# Patient Record
Sex: Male | Born: 1945 | Race: White | Hispanic: No | Marital: Married | Smoking: Former smoker
Health system: Southern US, Community
[De-identification: ages and names within clinical notes are randomized; demographics above are authoritative.]

## PROBLEM LIST (undated history)

## (undated) DIAGNOSIS — I77810 Thoracic aortic ectasia: Secondary | ICD-10-CM

## (undated) DIAGNOSIS — E669 Obesity, unspecified: Secondary | ICD-10-CM

## (undated) DIAGNOSIS — I10 Essential (primary) hypertension: Secondary | ICD-10-CM

## (undated) DIAGNOSIS — G4733 Obstructive sleep apnea (adult) (pediatric): Secondary | ICD-10-CM

## (undated) DIAGNOSIS — E119 Type 2 diabetes mellitus without complications: Secondary | ICD-10-CM

## (undated) HISTORY — DX: Obstructive sleep apnea (adult) (pediatric): G47.33

## (undated) HISTORY — DX: Obesity, unspecified: E66.9

## (undated) HISTORY — DX: Essential (primary) hypertension: I10

## (undated) HISTORY — DX: Thoracic aortic ectasia: I77.810

---

## 2005-02-02 ENCOUNTER — Emergency Department (HOSPITAL_COMMUNITY): Admission: EM | Admit: 2005-02-02 | Discharge: 2005-02-02 | Payer: Self-pay | Admitting: Emergency Medicine

## 2010-05-13 ENCOUNTER — Inpatient Hospital Stay (HOSPITAL_COMMUNITY): Admission: EM | Admit: 2010-05-13 | Discharge: 2010-05-17 | Payer: Self-pay | Admitting: Internal Medicine

## 2010-05-13 ENCOUNTER — Encounter: Payer: Self-pay | Admitting: Emergency Medicine

## 2010-05-14 ENCOUNTER — Ambulatory Visit: Payer: Self-pay | Admitting: Infectious Diseases

## 2011-03-17 LAB — DIFFERENTIAL
Basophils Absolute: 0 10*3/uL (ref 0.0–0.1)
Basophils Absolute: 0 10*3/uL (ref 0.0–0.1)
Eosinophils Absolute: 0 10*3/uL (ref 0.0–0.7)
Lymphocytes Relative: 11 % — ABNORMAL LOW (ref 12–46)
Lymphs Abs: 1.1 10*3/uL (ref 0.7–4.0)
Lymphs Abs: 1.3 10*3/uL (ref 0.7–4.0)
Monocytes Relative: 7 % (ref 3–12)
Monocytes Relative: 8 % (ref 3–12)
Neutro Abs: 7.8 10*3/uL — ABNORMAL HIGH (ref 1.7–7.7)
Neutrophils Relative %: 80 % — ABNORMAL HIGH (ref 43–77)

## 2011-03-17 LAB — CBC
HCT: 40.6 % (ref 39.0–52.0)
HCT: 41.5 % (ref 39.0–52.0)
Hemoglobin: 12.4 g/dL — ABNORMAL LOW (ref 13.0–17.0)
Hemoglobin: 13.9 g/dL (ref 13.0–17.0)
Hemoglobin: 14.5 g/dL (ref 13.0–17.0)
MCHC: 35 g/dL (ref 30.0–36.0)
MCV: 87.7 fL (ref 78.0–100.0)
Platelets: 170 10*3/uL (ref 150–400)
RBC: 4.05 MIL/uL — ABNORMAL LOW (ref 4.22–5.81)
RBC: 4.89 MIL/uL (ref 4.22–5.81)
RDW: 13.8 % (ref 11.5–15.5)
RDW: 13.9 % (ref 11.5–15.5)
WBC: 6.3 10*3/uL (ref 4.0–10.5)

## 2011-03-17 LAB — CSF CELL COUNT WITH DIFFERENTIAL
Lymphs, CSF: 37 % — ABNORMAL LOW (ref 40–80)
Segmented Neutrophils-CSF: 51 % — ABNORMAL HIGH (ref 0–6)
WBC, CSF: 20 /mm3 (ref 0–5)

## 2011-03-17 LAB — COMPREHENSIVE METABOLIC PANEL
ALT: 29 U/L (ref 0–53)
AST: 21 U/L (ref 0–37)
BUN: 11 mg/dL (ref 6–23)
CO2: 26 mEq/L (ref 19–32)
GFR calc Af Amer: >60 mL/min (ref >60)
Potassium: 4.5 mEq/L (ref 3.5–5.1)
Total Bilirubin: 1.7 mg/dL — ABNORMAL HIGH (ref 0.3–1.2)
Total Protein: 6.9 g/dL (ref 6.0–8.3)

## 2011-03-17 LAB — CSF CULTURE W GRAM STAIN

## 2011-03-17 LAB — GLUCOSE, CAPILLARY
Glucose-Capillary: 182 mg/dL — ABNORMAL HIGH (ref 70–99)
Glucose-Capillary: 202 mg/dL — ABNORMAL HIGH (ref 70–99)
Glucose-Capillary: 207 mg/dL — ABNORMAL HIGH (ref 70–99)
Glucose-Capillary: 214 mg/dL — ABNORMAL HIGH (ref 70–99)
Glucose-Capillary: 259 mg/dL — ABNORMAL HIGH (ref 70–99)
Glucose-Capillary: 265 mg/dL — ABNORMAL HIGH (ref 70–99)
Glucose-Capillary: 319 mg/dL — ABNORMAL HIGH (ref 70–99)

## 2011-03-17 LAB — HIV ANTIBODY (ROUTINE TESTING W REFLEX): HIV: NONREACTIVE

## 2011-03-17 LAB — BASIC METABOLIC PANEL
CO2: 25 mEq/L (ref 19–32)
Calcium: 8.6 mg/dL (ref 8.4–10.5)
Calcium: 8.7 mg/dL (ref 8.4–10.5)
Chloride: 100 mEq/L (ref 96–112)
Creatinine, Ser: 0.87 mg/dL (ref 0.4–1.5)
GFR calc Af Amer: >60 mL/min (ref >60)
GFR calc non Af Amer: >60 mL/min (ref >60)
GFR calc non Af Amer: >60 mL/min (ref >60)
Glucose, Bld: 159 mg/dL — ABNORMAL HIGH (ref 70–99)
Glucose, Bld: 214 mg/dL — ABNORMAL HIGH (ref 70–99)
Potassium: 4 mEq/L (ref 3.5–5.1)
Sodium: 138 mEq/L (ref 135–145)

## 2011-03-17 LAB — ROCKY MTN SPOTTED FVR AB, IGM-BLOOD: RMSF IgM: 0.09 IV (ref 0.00–0.89)

## 2011-03-17 LAB — CULTURE, BLOOD (ROUTINE X 2)

## 2011-03-17 LAB — ROCKY MTN SPOTTED FVR AB, IGG-BLOOD: RMSF IgG: 0.12 IV

## 2011-03-17 LAB — MRSA PCR SCREENING: MRSA by PCR: NEGATIVE

## 2011-03-17 LAB — PROTEIN AND GLUCOSE, CSF: Total  Protein, CSF: 489 mg/dL — ABNORMAL HIGH (ref 15–45)

## 2011-03-17 LAB — B. BURGDORFI ANTIBODIES: B burgdorferi Ab IgG+IgM: 0.73 {ISR}

## 2013-03-08 ENCOUNTER — Other Ambulatory Visit (HOSPITAL_COMMUNITY): Payer: Self-pay | Admitting: Nephrology

## 2013-03-17 ENCOUNTER — Ambulatory Visit (HOSPITAL_COMMUNITY)
Admission: RE | Admit: 2013-03-17 | Discharge: 2013-03-17 | Disposition: A | Discharge status: Home / Self Care | Payer: Medicare HMO | Source: Ambulatory Visit | Attending: Nephrology | Admitting: Nephrology

## 2013-03-17 DIAGNOSIS — N281 Cyst of kidney, acquired: Secondary | ICD-10-CM | POA: Insufficient documentation

## 2013-03-17 DIAGNOSIS — N289 Disorder of kidney and ureter, unspecified: Secondary | ICD-10-CM | POA: Insufficient documentation

## 2013-07-09 ENCOUNTER — Emergency Department (HOSPITAL_COMMUNITY): Payer: No Typology Code available for payment source

## 2013-07-09 ENCOUNTER — Encounter (HOSPITAL_COMMUNITY): Payer: Self-pay | Admitting: Emergency Medicine

## 2013-07-09 ENCOUNTER — Emergency Department (HOSPITAL_COMMUNITY)
Admission: EM | Admit: 2013-07-09 | Discharge: 2013-07-09 | Disposition: A | Discharge status: Home / Self Care | Payer: No Typology Code available for payment source | Attending: Emergency Medicine | Admitting: Emergency Medicine

## 2013-07-09 DIAGNOSIS — Z794 Long term (current) use of insulin: Secondary | ICD-10-CM | POA: Insufficient documentation

## 2013-07-09 DIAGNOSIS — Y92410 Unspecified street and highway as the place of occurrence of the external cause: Secondary | ICD-10-CM | POA: Diagnosis not present

## 2013-07-09 DIAGNOSIS — G8929 Other chronic pain: Secondary | ICD-10-CM | POA: Insufficient documentation

## 2013-07-09 DIAGNOSIS — Y9389 Activity, other specified: Secondary | ICD-10-CM | POA: Insufficient documentation

## 2013-07-09 DIAGNOSIS — Z79899 Other long term (current) drug therapy: Secondary | ICD-10-CM | POA: Diagnosis not present

## 2013-07-09 DIAGNOSIS — S298XXA Other specified injuries of thorax, initial encounter: Secondary | ICD-10-CM | POA: Diagnosis present

## 2013-07-09 DIAGNOSIS — E119 Type 2 diabetes mellitus without complications: Secondary | ICD-10-CM | POA: Diagnosis not present

## 2013-07-09 DIAGNOSIS — IMO0002 Reserved for concepts with insufficient information to code with codable children: Secondary | ICD-10-CM | POA: Insufficient documentation

## 2013-07-09 DIAGNOSIS — I10 Essential (primary) hypertension: Secondary | ICD-10-CM | POA: Diagnosis not present

## 2013-07-09 DIAGNOSIS — S20211A Contusion of right front wall of thorax, initial encounter: Secondary | ICD-10-CM

## 2013-07-09 DIAGNOSIS — S20219A Contusion of unspecified front wall of thorax, initial encounter: Secondary | ICD-10-CM | POA: Diagnosis not present

## 2013-07-09 DIAGNOSIS — Z7982 Long term (current) use of aspirin: Secondary | ICD-10-CM | POA: Diagnosis not present

## 2013-07-09 DIAGNOSIS — R0602 Shortness of breath: Secondary | ICD-10-CM | POA: Diagnosis not present

## 2013-07-09 DIAGNOSIS — S3981XA Other specified injuries of abdomen, initial encounter: Secondary | ICD-10-CM | POA: Insufficient documentation

## 2013-07-09 HISTORY — DX: Type 2 diabetes mellitus without complications: E11.9

## 2013-07-09 LAB — POTASSIUM: Potassium: 4.1 mEq/L (ref 3.5–5.1)

## 2013-07-09 LAB — TROPONIN I: Troponin I: <0.3 ng/mL (ref <0.30)

## 2013-07-09 LAB — CBC
HCT: 42.4 % (ref 39.0–52.0)
MCHC: 35.6 g/dL (ref 30.0–36.0)
Platelets: 204 10*3/uL (ref 150–400)
RDW: 14 % (ref 11.5–15.5)

## 2013-07-09 LAB — BASIC METABOLIC PANEL
BUN: 13 mg/dL (ref 6–23)
GFR calc Af Amer: 82 mL/min — ABNORMAL LOW (ref >90)
GFR calc non Af Amer: 71 mL/min — ABNORMAL LOW (ref >90)
Potassium: 6 mEq/L — ABNORMAL HIGH (ref 3.5–5.1)
Sodium: 136 mEq/L (ref 135–145)

## 2013-07-09 LAB — POCT I-STAT, CHEM 8
Glucose, Bld: 155 mg/dL — ABNORMAL HIGH (ref 70–99)
HCT: 45 % (ref 39.0–52.0)
Hemoglobin: 15.3 g/dL (ref 13.0–17.0)
Potassium: 6.1 mEq/L — ABNORMAL HIGH (ref 3.5–5.1)

## 2013-07-09 MED ORDER — SODIUM CHLORIDE 0.9 % IV BOLUS (SEPSIS)
1000.0000 mL | Freq: Once | INTRAVENOUS | Status: AC
Start: 2013-07-09 — End: 2013-07-09
  Administered 2013-07-09: 1000 mL via INTRAVENOUS

## 2013-07-09 MED ORDER — IOHEXOL 300 MG/ML  SOLN
100.0000 mL | Freq: Once | INTRAMUSCULAR | Status: AC | PRN
  Administered 2013-07-09: 100 mL via INTRAVENOUS

## 2013-07-09 MED ORDER — MORPHINE SULFATE 4 MG/ML IJ SOLN
4.0000 mg | Freq: Once | INTRAMUSCULAR | Status: AC
  Administered 2013-07-09: 4 mg via INTRAVENOUS
  Filled 2013-07-09: qty 1

## 2013-07-09 MED ORDER — HYDROCODONE-ACETAMINOPHEN 5-325 MG PO TABS: 2.0000 | ORAL_TABLET | ORAL | Status: DC | PRN

## 2013-07-09 MED ORDER — IBUPROFEN 800 MG PO TABS: 800.0000 mg | ORAL_TABLET | Freq: Three times a day (TID) | ORAL | Status: AC

## 2013-07-09 MED ORDER — ONDANSETRON HCL 4 MG/2ML IJ SOLN
4.0000 mg | Freq: Once | INTRAMUSCULAR | Status: AC
  Administered 2013-07-09: 4 mg via INTRAVENOUS
  Filled 2013-07-09: qty 2

## 2013-07-09 NOTE — ED Notes (Signed)
Pt reported he ambulated to the bathroom already today

## 2013-07-09 NOTE — ED Notes (Signed)
Pt to CT

## 2013-07-09 NOTE — ED Notes (Signed)
md at bedside  Pt alert and oriented x4. Respirations even and unlabored, bilateral symmetrical rise and fall of chest. Skin warm and dry. In no acute distress. Denies needs.   
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
## 2016-01-31 DIAGNOSIS — N400 Enlarged prostate without lower urinary tract symptoms: Secondary | ICD-10-CM | POA: Diagnosis not present

## 2016-01-31 DIAGNOSIS — M109 Gout, unspecified: Secondary | ICD-10-CM | POA: Diagnosis not present

## 2016-01-31 DIAGNOSIS — E1165 Type 2 diabetes mellitus with hyperglycemia: Secondary | ICD-10-CM | POA: Diagnosis not present

## 2016-01-31 DIAGNOSIS — Z125 Encounter for screening for malignant neoplasm of prostate: Secondary | ICD-10-CM | POA: Diagnosis not present

## 2016-01-31 DIAGNOSIS — I10 Essential (primary) hypertension: Secondary | ICD-10-CM | POA: Diagnosis not present

## 2016-01-31 DIAGNOSIS — M179 Osteoarthritis of knee, unspecified: Secondary | ICD-10-CM | POA: Diagnosis not present

## 2016-04-25 DIAGNOSIS — M25569 Pain in unspecified knee: Secondary | ICD-10-CM | POA: Diagnosis not present

## 2016-05-08 DIAGNOSIS — Z0000 Encounter for general adult medical examination without abnormal findings: Secondary | ICD-10-CM | POA: Diagnosis not present

## 2016-05-28 DIAGNOSIS — R072 Precordial pain: Secondary | ICD-10-CM | POA: Diagnosis not present

## 2016-05-28 DIAGNOSIS — R0602 Shortness of breath: Secondary | ICD-10-CM | POA: Diagnosis not present

## 2016-05-28 DIAGNOSIS — Z888 Allergy status to other drugs, medicaments and biological substances status: Secondary | ICD-10-CM | POA: Diagnosis not present

## 2016-05-28 DIAGNOSIS — R9431 Abnormal electrocardiogram [ECG] [EKG]: Secondary | ICD-10-CM | POA: Diagnosis not present

## 2016-05-28 DIAGNOSIS — E119 Type 2 diabetes mellitus without complications: Secondary | ICD-10-CM | POA: Diagnosis not present

## 2016-05-28 DIAGNOSIS — R531 Weakness: Secondary | ICD-10-CM | POA: Diagnosis not present

## 2016-05-28 DIAGNOSIS — R079 Chest pain, unspecified: Secondary | ICD-10-CM | POA: Diagnosis not present

## 2016-05-28 DIAGNOSIS — Z79899 Other long term (current) drug therapy: Secondary | ICD-10-CM | POA: Diagnosis not present

## 2016-05-28 DIAGNOSIS — Z87891 Personal history of nicotine dependence: Secondary | ICD-10-CM | POA: Diagnosis not present

## 2016-05-28 DIAGNOSIS — I10 Essential (primary) hypertension: Secondary | ICD-10-CM | POA: Diagnosis not present

## 2016-05-28 DIAGNOSIS — Z794 Long term (current) use of insulin: Secondary | ICD-10-CM | POA: Diagnosis not present

## 2016-05-28 DIAGNOSIS — I445 Left posterior fascicular block: Secondary | ICD-10-CM | POA: Diagnosis not present

## 2016-05-29 DIAGNOSIS — R5383 Other fatigue: Secondary | ICD-10-CM | POA: Diagnosis not present

## 2016-05-29 DIAGNOSIS — E784 Other hyperlipidemia: Secondary | ICD-10-CM | POA: Diagnosis not present

## 2016-05-29 DIAGNOSIS — I10 Essential (primary) hypertension: Secondary | ICD-10-CM | POA: Diagnosis not present

## 2016-05-29 DIAGNOSIS — E1165 Type 2 diabetes mellitus with hyperglycemia: Secondary | ICD-10-CM | POA: Diagnosis not present

## 2016-07-04 DIAGNOSIS — I10 Essential (primary) hypertension: Secondary | ICD-10-CM | POA: Diagnosis not present

## 2016-07-04 DIAGNOSIS — E1165 Type 2 diabetes mellitus with hyperglycemia: Secondary | ICD-10-CM | POA: Diagnosis not present

## 2016-07-04 DIAGNOSIS — E784 Other hyperlipidemia: Secondary | ICD-10-CM | POA: Diagnosis not present

## 2016-07-04 DIAGNOSIS — Z79899 Other long term (current) drug therapy: Secondary | ICD-10-CM | POA: Diagnosis not present

## 2016-08-07 DIAGNOSIS — E2749 Other adrenocortical insufficiency: Secondary | ICD-10-CM | POA: Diagnosis not present

## 2016-08-07 DIAGNOSIS — E1165 Type 2 diabetes mellitus with hyperglycemia: Secondary | ICD-10-CM | POA: Diagnosis not present

## 2016-08-07 DIAGNOSIS — R300 Dysuria: Secondary | ICD-10-CM | POA: Diagnosis not present

## 2016-08-07 DIAGNOSIS — E784 Other hyperlipidemia: Secondary | ICD-10-CM | POA: Diagnosis not present

## 2016-08-07 DIAGNOSIS — E669 Obesity, unspecified: Secondary | ICD-10-CM | POA: Diagnosis not present

## 2016-08-07 DIAGNOSIS — E039 Hypothyroidism, unspecified: Secondary | ICD-10-CM | POA: Diagnosis not present

## 2016-08-07 DIAGNOSIS — I10 Essential (primary) hypertension: Secondary | ICD-10-CM | POA: Diagnosis not present

## 2016-08-07 DIAGNOSIS — M179 Osteoarthritis of knee, unspecified: Secondary | ICD-10-CM | POA: Diagnosis not present

## 2016-08-07 DIAGNOSIS — E291 Testicular hypofunction: Secondary | ICD-10-CM | POA: Diagnosis not present

## 2016-09-02 ENCOUNTER — Encounter: Payer: Self-pay | Admitting: Cardiology

## 2016-09-04 ENCOUNTER — Other Ambulatory Visit: Payer: Self-pay

## 2016-09-04 ENCOUNTER — Encounter: Payer: Self-pay | Admitting: Cardiology

## 2016-09-04 ENCOUNTER — Encounter (INDEPENDENT_AMBULATORY_CARE_PROVIDER_SITE_OTHER): Payer: Self-pay

## 2016-09-04 ENCOUNTER — Ambulatory Visit (INDEPENDENT_AMBULATORY_CARE_PROVIDER_SITE_OTHER): Payer: PPO | Admitting: Cardiology

## 2016-09-04 ENCOUNTER — Ambulatory Visit (HOSPITAL_COMMUNITY): Payer: PPO | Attending: Cardiology

## 2016-09-04 VITALS — BP 118/82 | HR 112 | Ht 71.0 in | Wt 273.8 lb

## 2016-09-04 DIAGNOSIS — Z87891 Personal history of nicotine dependence: Secondary | ICD-10-CM | POA: Diagnosis not present

## 2016-09-04 DIAGNOSIS — I77810 Thoracic aortic ectasia: Secondary | ICD-10-CM | POA: Diagnosis not present

## 2016-09-04 DIAGNOSIS — T733XXA Exhaustion due to excessive exertion, initial encounter: Secondary | ICD-10-CM

## 2016-09-04 DIAGNOSIS — E785 Hyperlipidemia, unspecified: Secondary | ICD-10-CM | POA: Insufficient documentation

## 2016-09-04 DIAGNOSIS — R0789 Other chest pain: Secondary | ICD-10-CM | POA: Insufficient documentation

## 2016-09-04 DIAGNOSIS — R000 Tachycardia, unspecified: Secondary | ICD-10-CM

## 2016-09-04 DIAGNOSIS — I10 Essential (primary) hypertension: Secondary | ICD-10-CM | POA: Insufficient documentation

## 2016-09-04 HISTORY — DX: Essential (primary) hypertension: I10

## 2016-09-04 LAB — ECHOCARDIOGRAM COMPLETE
HEIGHTINCHES: 71 in
Weight: 4380.8 oz

## 2016-09-04 LAB — D-DIMER, QUANTITATIVE (NOT AT ARMC): D DIMER QUANT: 0.72 ug{FEU}/mL — AB (ref 0.00–0.50)

## 2016-09-04 NOTE — Patient Instructions (Addendum)
Medication Instructions:  Your physician recommends that you continue on your current medications as directed. Please refer to the Current Medication list given to you today.   Labwork: TODAY: ddimer, cmet, tsh cbc  Testing/Procedures: Your physician has recommended that you wear a holter monitor. Holter monitors are medical devices that record the heart's electrical activity. Doctors most often use these monitors to diagnose arrhythmias. Arrhythmias are problems with the speed or rhythm of the heartbeat. The monitor is a small, portable device. You can wear one while you do your normal daily activities. This is usually used to diagnose what is causing palpitations/syncope (passing out).   Your physician has requested that you have an echocardiogram. Echocardiography is a painless test that uses sound waves to create images of your heart. It provides your doctor with information about the size and shape of your heart and how well your heart's chambers and valves are working. This procedure takes approximately one hour. There are no restrictions for this procedure.  Your physician has requested that you have a lexiscan myoview. For further information please visit [email or web address]. Please follow instruction sheet, as given.  Your physician has recommended that you have a sleep study. This test records several body functions during sleep, including: brain activity, eye movement, oxygen and carbon dioxide blood levels, heart rate and rhythm, breathing rate and rhythm, the flow of air through your mouth and nose, snoring, body muscle movements, and chest and belly movement.  Follow-Up: Your physician recommends that you schedule a follow-up appointment AS NEEDED with Dr. Mayford Knifeurner pending study results.   Any Other Special Instructions Will Be Listed Below (If Applicable).     If you need a refill on your cardiac medications before your next appointment, please call your pharmacy.

## 2016-09-04 NOTE — Progress Notes (Signed)
Cardiology Office Note    Date:  09/04/2016   ID:  Clarence Estrada, Clarence Estrada 05-02-1946, MRN [id number]  PCP:  Samuel Jester, DO  Cardiologist:  Armanda Magic, MD   Chief Complaint  Patient presents with  . New Evaluation    palpitations, HTN, CP    History of Present Illness:  Clarence Estrada is a 70 y.o. male with a history of HTN and DM who presents today for evaluation of fatigue and HTN.  He says that he has been feeling fatigued for some time and his PCP referred him to make sure he does not have a cardiac issue. He says that his symptoms have been present to some degree for the past 16 years.   He says that he cannot stand up too long because his legs will get weak and he feels like he is going to fall.  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  He says occasionally he will feel some pressure in his chest but is not exertional and occurs when sitting in the chair.  There is no radiation of the discomfort, nausea, diaphoresis or SOB with the pressure.  He has been having problems with sweating recently but not associated with his chest discomfort.  He denies any SOB but says that he does not do much to make him SOB.  He says that he cannot stand for long periods of time because he will get a rush and his energy is gone and he has to sit down.  He says that heat makes his symptoms worse.  He denies any irregularity of his heart beat but has noticed it beat fast on occasion.  He has not been diagnosed with OSA in the past but does snore.  His wife has not noticed any apnea while sleeping but he does get sleepy and have to take a nap during the day.     Past Medical History:  Diagnosis Date  . Benign essential HTN 09/04/2016  . Diabetes mellitus without complication (HCC)   . Hypertension     History reviewed. No pertinent surgical history.  Current Medications: Outpatient Medications Prior to Visit  Medication Sig Dispense Refill  . benazepril (LOTENSIN) 5 MG tablet Take 5 mg by mouth daily.      Marland Kitchen ibuprofen (ADVIL,MOTRIN) 800 MG tablet Take 1 tablet (800 mg total) by mouth 3 (three) times daily. 21 tablet 0  . insulin aspart (NOVOLOG) 100 UNIT/ML injection Inject into the skin as needed for high blood sugar (sliding scale as needed).    . insulin glargine (LANTUS) 100 UNIT/ML injection Inject 50 Units into the skin daily.    . metFORMIN (GLUMETZA) 1000 MG (MOD) 24 hr tablet Take 1,000 mg by mouth 2 (two) times daily with a meal.    . tamsulosin (FLOMAX) 0.4 MG CAPS Take 0.4 mg by mouth.    Marland Kitchen amLODipine (NORVASC) 5 MG tablet Take 5 mg by mouth daily.    Marland Kitchen aspirin EC 81 MG tablet Take 81 mg by mouth daily.    . fish oil-omega-3 fatty acids 1000 MG capsule Take 2 g by mouth daily.    Marland Kitchen HYDROcodone-acetaminophen (NORCO/VICODIN) 5-325 MG per tablet Take 2 tablets by mouth every 4 (four) hours as needed for pain. (Patient not taking: Reported on 09/04/2016) 10 tablet 0   No facility-administered medications prior to visit.      Allergies:   Hydrocortisone; Sulfa antibiotics; and Cortisone acetate [cortisone]   Social History   Social  History  . Marital status: Married    Spouse name: N/A  . Number of children: N/A  . Years of education: N/A   Social History Main Topics  . Smoking status: Former Smoker    Quit date: 09/04/1988  . Smokeless tobacco: Never Used  . Alcohol use Yes     Comment: social  . Drug use: No  . Sexual activity: Not Asked   Other Topics Concern  . None   Social History Narrative  . None     Family History:  The patient's family history includes CAD in his sister; Heart disease in his sister; Hodgkin's lymphoma in his sister; Polymyositis in his father.   ROS:   Please see the history of present illness.    ROS All other systems reviewed and are negative.   PHYSICAL EXAM:   VS:  BP 118/82   Pulse (!) 112   Ht 5\' 11"  (1.803 m)   Wt 273 lb 12.8 oz (124.2 kg)   BMI 38.19 kg/m    GEN: Well nourished, well developed, in no acute distress  HEENT:  normal  Neck: no JVD, carotid bruits, or masses Cardiac: RRR; no murmurs, rubs, or gallops,no edema.  Intact distal pulses bilaterally.  Respiratory:  clear to auscultation bilaterally, normal work of breathing GI: soft, nontender, nondistended, + BS MS: no deformity or atrophy  Skin: warm and dry, no rash Neuro:  Alert and Oriented x 3, Strength and sensation are intact Psych: euthymic mood, full affect  Wt Readings from Last 3 Encounters:  09/04/16 273 lb 12.8 oz (124.2 kg)      Studies/Labs Reviewed:   EKG:  EKG is ordered today.  The ekg ordered today demonstrates sinus tachycardia at 112bpm with no ST changes  Recent Labs: No results found for requested labs within last 8760 hours.   Lipid Panel No results found for: CHOL, TRIG, HDL, CHOLHDL, VLDL, LDLCALC, LDLDIRECT  Additional studies/ records that were reviewed today include:  Office notes from PCP    ASSESSMENT:    1. Chest pressure   2. Benign essential HTN   3. Tachycardia   4. Fatigue due to excessive exertion, initial encounter      PLAN:  In order of problems listed above:  1. Atypical chest pressure with no ischemia on EKG but he has CRFs including HTN, morbid obesity, DM and remote tobacco use.  I will get a Lexiscan myoview to rule out ischemia.  2. HTN - BP controlled.  Continue ACE I. 3. Sinus tachycardia that is worse when standing and HR goes to 140bpm.  He may have orthostatic tachycardia syndrome.  I have encouraged him to stay hydrated.  I will get a 24 hour Holter to assess average heart rate.  I will check a D-Dimer, TSH, CBC and CMET. 4.   Fatigue ? Etiology - he snores and is morbidly obese and likely has OSA.  I will get a split night sleep study to evaluated further.  He also has baseline tachycardia so need to consider tachycardia induced DCM.  Will get echo today to evaluate.      Medication Adjustments/Labs and Tests Ordered: Current medicines are reviewed at length with the patient  today.  Concerns regarding medicines are outlined above.  Medication changes, Labs and Tests ordered today are listed in the Patient Instructions below.  Patient Instructions  Medication Instructions:  Your physician recommends that you continue on your current medications as directed. Please refer to the Current Medication list  given to you today.   Labwork: None  Testing/Procedures: Your physician has recommended that you wear a holter monitor. Holter monitors are medical devices that record the heart's electrical activity. Doctors most often use these monitors to diagnose arrhythmias. Arrhythmias are problems with the speed or rhythm of the heartbeat. The monitor is a small, portable device. You can wear one while you do your normal daily activities. This is usually used to diagnose what is causing palpitations/syncope (passing out).   Your physician has requested that you have an echocardiogram. Echocardiography is a painless test that uses sound waves to create images of your heart. It provides your doctor with information about the size and shape of your heart and how well your heart's chambers and valves are working. This procedure takes approximately one hour. There are no restrictions for this procedure.  Your physician has requested that you have a lexiscan myoview. For further information please visit [email or web address]. Please follow instruction sheet, as given.  Follow-Up: Your physician recommends that you schedule a follow-up appointment AS NEEDED with Dr. Mayford Knifeurner pending study results.   Any Other Special Instructions Will Be Listed Below (If Applicable).     If you need a refill on your cardiac medications before your next appointment, please call your pharmacy.      Signed, Armanda Magicraci Maronda Caison, MD  09/04/2016 2:08 PM    [facility] [address], [place], [place]  [address] Phone: [phone]) [phone]; Fax: [phone]

## 2016-09-05 ENCOUNTER — Telehealth: Payer: Self-pay

## 2016-09-05 ENCOUNTER — Telehealth (HOSPITAL_COMMUNITY): Payer: Self-pay | Admitting: *Deleted

## 2016-09-05 ENCOUNTER — Other Ambulatory Visit: Payer: PPO | Admitting: *Deleted

## 2016-09-05 ENCOUNTER — Ambulatory Visit (INDEPENDENT_AMBULATORY_CARE_PROVIDER_SITE_OTHER)
Admission: RE | Admit: 2016-09-05 | Discharge: 2016-09-05 | Disposition: A | Discharge status: Home / Self Care | Payer: PPO | Source: Ambulatory Visit | Attending: Cardiology | Admitting: Cardiology

## 2016-09-05 DIAGNOSIS — R0789 Other chest pain: Secondary | ICD-10-CM | POA: Diagnosis not present

## 2016-09-05 DIAGNOSIS — R7989 Other specified abnormal findings of blood chemistry: Secondary | ICD-10-CM

## 2016-09-05 DIAGNOSIS — R791 Abnormal coagulation profile: Secondary | ICD-10-CM | POA: Diagnosis not present

## 2016-09-05 DIAGNOSIS — I77810 Thoracic aortic ectasia: Secondary | ICD-10-CM

## 2016-09-05 DIAGNOSIS — Z79899 Other long term (current) drug therapy: Secondary | ICD-10-CM | POA: Diagnosis not present

## 2016-09-05 LAB — CBC WITH DIFFERENTIAL/PLATELET
BASOS ABS: 0 {cells}/uL (ref 0–200)
Basophils Relative: 0 %
EOS ABS: 96 {cells}/uL (ref 15–500)
Eosinophils Relative: 1 %
HEMATOCRIT: 46 % (ref 38.5–50.0)
HEMOGLOBIN: 16 g/dL (ref 13.2–17.1)
LYMPHS ABS: 1824 {cells}/uL (ref 850–3900)
Lymphocytes Relative: 19 %
MCH: 31.7 pg (ref 27.0–33.0)
MCHC: 34.8 g/dL (ref 32.0–36.0)
MCV: 91.3 fL (ref 80.0–100.0)
MPV: 9.8 fL (ref 7.5–12.5)
Monocytes Absolute: 672 cells/uL (ref 200–950)
Monocytes Relative: 7 %
NEUTROS ABS: 7008 {cells}/uL (ref 1500–7800)
Neutrophils Relative %: 73 %
Platelets: 206 10*3/uL (ref 140–400)
RBC: 5.04 MIL/uL (ref 4.20–5.80)
RDW: 15.5 % — ABNORMAL HIGH (ref 11.0–15.0)
WBC: 9.6 10*3/uL (ref 3.8–10.8)

## 2016-09-05 LAB — COMPREHENSIVE METABOLIC PANEL
ALBUMIN: 3.9 g/dL (ref 3.5–5.0)
ALT: 45 U/L (ref 17–63)
AST: 35 U/L (ref 15–41)
Alkaline Phosphatase: 60 U/L (ref 38–126)
Anion gap: 10 (ref 5–15)
BILIRUBIN TOTAL: 1.8 mg/dL — AB (ref 0.3–1.2)
BUN: 12 mg/dL (ref 6–20)
CHLORIDE: 103 mmol/L (ref 101–111)
CO2: 25 mmol/L (ref 22–32)
CREATININE: 1.06 mg/dL (ref 0.61–1.24)
Calcium: 9.8 mg/dL (ref 8.9–10.3)
GFR calc Af Amer: >60 mL/min (ref >60)
GFR calc non Af Amer: >60 mL/min (ref >60)
GLUCOSE: 204 mg/dL — AB (ref 65–99)
POTASSIUM: 4.4 mmol/L (ref 3.5–5.1)
Sodium: 138 mmol/L (ref 135–145)
Total Protein: 6.5 g/dL (ref 6.5–8.1)

## 2016-09-05 LAB — TSH: TSH: 2.77 mIU/L (ref 0.40–4.50)

## 2016-09-05 MED ORDER — IOPAMIDOL (ISOVUE-370) INJECTION 76%
80.0000 mL | Freq: Once | INTRAVENOUS | Status: AC | PRN
  Administered 2016-09-05: 80 mL via INTRAVENOUS

## 2016-09-05 NOTE — Telephone Encounter (Signed)
Patient given detailed instructions per Myocardial Perfusion Study Information Sheet for the test on 09/08/16 at 7:45. Patient notified to arrive 15 minutes early and that it is imperative to arrive on time for appointment to keep from having the test rescheduled.  If you need to cancel or reschedule your appointment, please call the office within 24 hours of your appointment. Failure to do so may result in a cancellation of your appointment, and a $50 no show fee. Patient verbalized understanding.Daneil DolinSharon S Brooks

## 2016-09-05 NOTE — Addendum Note (Signed)
Addended by: Madalyn RobOX, Dewanda Fennema A on: 09/05/2016 01:38 PM   Modules accepted: Orders

## 2016-09-05 NOTE — Telephone Encounter (Signed)
Per Dr. Mayford Knifeurner, patient to come in today for chest CT to rule out PE. Informed patient of results and verbal understanding expressed.   CT ordered and scheduled for today at 1330.  Patient will arrive at noon for lab work and understands not to eat prior to test. He agrees with treatment plan.

## 2016-09-05 NOTE — Telephone Encounter (Signed)
Informed patient of results and verbal understanding expressed.  Per Dr. Mayford Knifeurner, ECHO ordered to be scheduled in 1 year. Patient agrees with treatment plan.

## 2016-09-05 NOTE — Telephone Encounter (Signed)
-----   Message from Quintella Reichertraci R Turner, MD sent at 09/05/2016 10:26 AM EDT ----- Echo showed normal LVF with mildly dilated aortic root at 39mm and normal right sided chambers

## 2016-09-05 NOTE — Addendum Note (Signed)
Addended by: Madalyn RobOX, Tinesha Siegrist A on: 09/05/2016 03:20 PM   Modules accepted: Orders

## 2016-09-05 NOTE — Telephone Encounter (Signed)
-----   Message from Quintella Reichertraci R Turner, MD sent at 09/05/2016  9:58 AM EDT ----- Please get chest CT angio to rule out PE - he will need a BMET

## 2016-09-08 ENCOUNTER — Encounter (INDEPENDENT_AMBULATORY_CARE_PROVIDER_SITE_OTHER): Payer: PPO

## 2016-09-08 ENCOUNTER — Ambulatory Visit (HOSPITAL_COMMUNITY): Payer: PPO | Attending: Internal Medicine

## 2016-09-08 DIAGNOSIS — E119 Type 2 diabetes mellitus without complications: Secondary | ICD-10-CM | POA: Diagnosis not present

## 2016-09-08 DIAGNOSIS — R0789 Other chest pain: Secondary | ICD-10-CM

## 2016-09-08 DIAGNOSIS — R000 Tachycardia, unspecified: Secondary | ICD-10-CM

## 2016-09-08 DIAGNOSIS — R5383 Other fatigue: Secondary | ICD-10-CM | POA: Insufficient documentation

## 2016-09-08 DIAGNOSIS — I10 Essential (primary) hypertension: Secondary | ICD-10-CM | POA: Insufficient documentation

## 2016-09-08 LAB — MYOCARDIAL PERFUSION IMAGING
CHL CUP NUCLEAR SDS: 0
CHL CUP NUCLEAR SSS: 1
LHR: 0.28
LVDIAVOL: 93 mL (ref 62–150)
LVSYSVOL: 45 mL
NUC STRESS TID: 0.97
Peak HR: 129 {beats}/min
Rest HR: 111 {beats}/min
SRS: 1

## 2016-09-08 MED ORDER — TECHNETIUM TC 99M TETROFOSMIN IV KIT
33.0000 | PACK | Freq: Once | INTRAVENOUS | Status: AC | PRN
  Administered 2016-09-08: 33 via INTRAVENOUS
  Filled 2016-09-08: qty 33

## 2016-09-08 MED ORDER — REGADENOSON 0.4 MG/5ML IV SOLN
0.4000 mg | Freq: Once | INTRAVENOUS | Status: AC
Start: 2016-09-08 — End: 2016-09-08
  Administered 2016-09-08: 0.4 mg via INTRAVENOUS

## 2016-09-08 MED ORDER — TECHNETIUM TC 99M TETROFOSMIN IV KIT
10.9000 | PACK | Freq: Once | INTRAVENOUS | Status: AC | PRN
  Administered 2016-09-08: 10.9 via INTRAVENOUS
  Filled 2016-09-08: qty 11

## 2016-09-16 ENCOUNTER — Ambulatory Visit: Payer: PPO | Admitting: Cardiology

## 2016-10-26 ENCOUNTER — Ambulatory Visit (HOSPITAL_BASED_OUTPATIENT_CLINIC_OR_DEPARTMENT_OTHER): Payer: PPO | Attending: Cardiology | Admitting: Cardiology

## 2016-10-26 VITALS — Ht 71.0 in | Wt 270.0 lb

## 2016-10-26 DIAGNOSIS — T733XXA Exhaustion due to excessive exertion, initial encounter: Secondary | ICD-10-CM

## 2016-10-26 DIAGNOSIS — G4733 Obstructive sleep apnea (adult) (pediatric): Secondary | ICD-10-CM | POA: Insufficient documentation

## 2016-10-27 NOTE — Procedures (Signed)
   Patient Name: Clarence Estrada, Shade Study Date: 10/26/2016 Gender: Male D.O.B: [birth year] Age (years): [age] Referring Provider: Armanda Magicraci [place] MD, ABSM Height (inches): 71 Interpreting Physician: Armanda Magicraci Zonie Crutcher MD, ABSM Weight (lbs): 270 RPSGT: Armen PickupFord, Evelyn BMI: 38 MRN: [id number] Neck Size: 17.50  CLINICAL INFORMATION Sleep Study Type: NPSG Indication for sleep study: Fatigue Epworth Sleepiness Score: 3  SLEEP STUDY TECHNIQUE As per the AASM Manual for the Scoring of Sleep and Associated Events v2.3 (April 2016) with a hypopnea requiring 4% desaturations. The channels recorded and monitored were frontal, central and occipital EEG, electrooculogram (EOG), submentalis EMG (chin), nasal and oral airflow, thoracic and abdominal wall motion, anterior tibialis EMG, snore microphone, electrocardiogram, and pulse oximetry.  MEDICATIONS Medications self-administered by patient taken the night of the study : N/A  SLEEP ARCHITECTURE The study was initiated at 10:15:05 PM and ended at 4:31:09 AM. Sleep onset time was 48.5 minutes and the sleep efficiency was 62.5%. The total sleep time was 235.0 minutes. Stage REM latency was 81.5 minutes. The patient spent 7.66% of the night in stage N1 sleep, 70.64% in stage N2 sleep, 1.06% in stage N3 and 20.64% in REM. Alpha intrusion was absent. Supine sleep was 0.00%.  RESPIRATORY PARAMETERS The overall apnea/hypopnea index (AHI) was 7.4 per hour. There were 10 total apneas, including 1 obstructive, 9 central and 0 mixed apneas. There were 19 hypopneas and 36 RERAs. The AHI during Stage REM sleep was 24.7 per hour. AHI while supine was N/A per hour. The mean oxygen saturation was 93.49%. The minimum SpO2 during sleep was 87.00%. Soft snoring was noted during this study.  CARDIAC DATA The 2 lead EKG demonstrated sinus rhythm. The mean heart rate was 62.00 beats per minute. Other EKG findings include: None.  LEG MOVEMENT DATA The total PLMS were 30 with  a resulting PLMS index of 7.66. Associated arousal with leg movement index was 0.3 .  IMPRESSIONS - Mild obstructive sleep apnea occurred during this study (AHI = 7.4/h). - No significant central sleep apnea occurred during this study (CAI = 2.3/h). - Mild oxygen desaturation was noted during this study (Min O2 = 87.00%). - The patient snored with Soft snoring volume. - No cardiac abnormalities were noted during this study. - Mild periodic limb movements of sleep occurred during the study. No significant associated arousals.  DIAGNOSIS - Obstructive Sleep Apnea (327.23 [G47.33 ICD-10])  RECOMMENDATIONS - Recommend CPAP titration in lab for mild OSA overall and moderate OSA during REM sleep. - Avoid alcohol, sedatives and other CNS depressants that may worsen sleep apnea and disrupt normal sleep architecture. - Sleep hygiene should be reviewed to assess factors that may improve sleep quality. - Weight management and regular exercise should be initiated or continued if appropriate.  Armanda Magicraci Jamesmichael Shadd Diplomate, American Board of Sleep Medicine  ELECTRONICALLY SIGNED ON:  10/27/2016, 7:31 PM [facility] PH: [phone]   FX: [phone] ACCREDITED BY THE AMERICAN ACADEMY OF SLEEP MEDICINE

## 2016-10-28 ENCOUNTER — Encounter: Payer: Self-pay | Admitting: *Deleted

## 2016-10-28 NOTE — Addendum Note (Signed)
Addended by: Reesa ChewJONES, Lennix Rotundo G on: 10/28/2016 11:35 AM   Modules accepted: Orders

## 2016-10-28 NOTE — Progress Notes (Signed)
10/28/16  Called patient he is aware of results and that a letter will be mailed with date for titration

## 2016-10-29 DIAGNOSIS — H40033 Anatomical narrow angle, bilateral: Secondary | ICD-10-CM | POA: Diagnosis not present

## 2016-10-29 DIAGNOSIS — H2513 Age-related nuclear cataract, bilateral: Secondary | ICD-10-CM | POA: Diagnosis not present

## 2016-11-07 DIAGNOSIS — I10 Essential (primary) hypertension: Secondary | ICD-10-CM | POA: Diagnosis not present

## 2016-11-07 DIAGNOSIS — E669 Obesity, unspecified: Secondary | ICD-10-CM | POA: Diagnosis not present

## 2016-11-07 DIAGNOSIS — E039 Hypothyroidism, unspecified: Secondary | ICD-10-CM | POA: Diagnosis not present

## 2016-11-07 DIAGNOSIS — M109 Gout, unspecified: Secondary | ICD-10-CM | POA: Diagnosis not present

## 2016-11-07 DIAGNOSIS — M179 Osteoarthritis of knee, unspecified: Secondary | ICD-10-CM | POA: Diagnosis not present

## 2016-11-07 DIAGNOSIS — E1165 Type 2 diabetes mellitus with hyperglycemia: Secondary | ICD-10-CM | POA: Diagnosis not present

## 2016-11-07 DIAGNOSIS — E784 Other hyperlipidemia: Secondary | ICD-10-CM | POA: Diagnosis not present

## 2016-11-07 DIAGNOSIS — Z79899 Other long term (current) drug therapy: Secondary | ICD-10-CM | POA: Diagnosis not present

## 2016-12-16 ENCOUNTER — Ambulatory Visit (HOSPITAL_BASED_OUTPATIENT_CLINIC_OR_DEPARTMENT_OTHER): Payer: PPO | Attending: Cardiology | Admitting: Cardiology

## 2016-12-16 DIAGNOSIS — G4733 Obstructive sleep apnea (adult) (pediatric): Secondary | ICD-10-CM | POA: Diagnosis not present

## 2016-12-28 NOTE — Procedures (Signed)
   Patient Name: Clarence Estrada, Clarence Estrada: 12/16/2016 Gender: Male D.O.B: Dec 18, 1946 Age (years): 89 Referring Provider: Armanda Magicraci Turner MD, ABSM Height (inches): 71 Interpreting Physician: Armanda Magicraci Turner MD, ABSM Weight (lbs): 270 RPSGT: Shelah LewandowskyGregory, Kenyon BMI: 38 MRN: [id number] Neck Size: 17.50  CLINICAL INFORMATION The patient is referred for a CPAP titration to treat sleep apnea. Estrada of NPSG, Split Night or HST:10/26/2016  SLEEP STUDY TECHNIQUE As per the AASM Manual for the Scoring of Sleep and Associated Events v2.3 (April 2016) with a hypopnea requiring 4% desaturations.  The channels recorded and monitored were frontal, central and occipital EEG, electrooculogram (EOG), submentalis EMG (chin), nasal and oral airflow, thoracic and abdominal wall motion, anterior tibialis EMG, snore microphone, electrocardiogram, and pulse oximetry. Continuous positive airway pressure (CPAP) was initiated at the beginning of the study and titrated to treat sleep-disordered breathing.  MEDICATIONS Medications self-administered by patient taken the night of the study : N/A  TECHNICIAN COMMENTS Comments added by technician: Patient was restless all through the night.  Comments added by scorer: N/A  RESPIRATORY PARAMETERS Optimal PAP Pressure (cm): 8  AHI at Optimal Pressure (/hr):1.2 Overall Minimal O2 (%):87.00  Supine % at Optimal Pressure (%):0 Minimal O2 at Optimal Pressure (%): 90.0    SLEEP ARCHITECTURE The study was initiated at 10:28:25 PM and ended at 4:37:18 AM.  Sleep onset time was 19.6 minutes and the sleep efficiency was 69.0%. The total sleep time was 254.5 minutes.  The patient spent 12.97% of the night in stage N1 sleep, 83.30% in stage N2 sleep, 0.00% in stage N3 and 3.73% in REM.Stage REM latency was 119.5 minutes  Wake after sleep onset was 94.8. Alpha intrusion was absent. Supine sleep was 18.27%.  CARDIAC DATA The 2 lead EKG demonstrated sinus rhythm. The mean  heart rate was 67.26 beats per minute. Other EKG findings include: None.  LEG MOVEMENT DATA The total Periodic Limb Movements of Sleep (PLMS) were 0. The PLMS index was 0.00. A PLMS index of <15 is considered normal in adults.  IMPRESSIONS - The optimal PAP pressure was 8 cm of water. - Central sleep apnea was not noted during this titration (CAI = 0.0/h). - Mild oxygen desaturations were observed during this titration (min O2 = 87.00%). - The patient snored with Soft snoring volume during this titration study. - No cardiac abnormalities were observed during this study. - Clinically significant periodic limb movements were not noted during this study. Arousals associated with PLMs were rare.  DIAGNOSIS - Obstructive Sleep Apnea (327.23 [G47.33 ICD-10])  RECOMMENDATIONS - Trial of CPAP therapy on 8 cm H2O with a Medium size Fisher&Paykel Full Face Mask Simplus mask and heated humidification. - Avoid alcohol, sedatives and other CNS depressants that may worsen sleep apnea and disrupt normal sleep architecture. - Sleep hygiene should be reviewed to assess factors that may improve sleep quality. - Weight management and regular exercise should be initiated or continued. - Return to Sleep Center for re-evaluation after 10 weeks of therapy  Armanda Magicraci Turner Diplomate, American Board of Sleep Medicine  ELECTRONICALLY SIGNED ON:  12/28/2016, 2:13 PM [facility] PH: [phone]   FX: [phone] ACCREDITED BY THE AMERICAN ACADEMY OF SLEEP MEDICINE

## 2016-12-31 ENCOUNTER — Telehealth: Payer: Self-pay | Admitting: *Deleted

## 2016-12-31 NOTE — Telephone Encounter (Signed)
Called the patient and gave him his sleep study results, he verbalized understanding 

## 2017-01-22 DIAGNOSIS — G4733 Obstructive sleep apnea (adult) (pediatric): Secondary | ICD-10-CM | POA: Diagnosis not present

## 2017-02-04 ENCOUNTER — Telehealth: Payer: Self-pay | Admitting: *Deleted

## 2017-02-04 NOTE — Telephone Encounter (Signed)
Hello Dr Mayford Knifeurner, the patient is complaining of having scabs inside his nose and on the outside of his nose. He has been on his machine 2 weeks using the nasal pillows and his nose is very sore. He is planning on going back to the full mask he used for his sleep study to see if that helps. He feels like he slept better without this unit because  He wakes up frequently during the night and he has been taking naps during the day now. Please advise

## 2017-02-05 ENCOUNTER — Telehealth: Payer: Self-pay | Admitting: *Deleted

## 2017-02-05 DIAGNOSIS — G4733 Obstructive sleep apnea (adult) (pediatric): Secondary | ICD-10-CM

## 2017-02-05 NOTE — Telephone Encounter (Signed)
-----   Message from Traci R Turner, MD sent at 02/04/2017  6:02 PM EST ----- Regarding: RE: Cpap We could try a nasal mask if he would like  Traci ----- Message ----- From: Arbutus Nelligan G Laurin Paulo, CMA Sent: 02/04/2017   5:17 PM To: Traci R Turner, MD Subject: Cpap                                           Hello Dr Turner, the patient is complaining of having scabs inside his nose and on the outside of his nose. He has been on his machine 2 weeks using the nasal pillows and his nose is very sore. He is planning on going back to the full mask he used for his sleep study to see if that helps. He feels like he slept better without this unit because He wakes up frequently during the night and he has been taking naps during the day now. Please advise  

## 2017-02-05 NOTE — Telephone Encounter (Signed)
Called and left a message on cell phone to return my call

## 2017-02-05 NOTE — Telephone Encounter (Signed)
-----   Message from Quintella Reichertraci R Turner, MD sent at 02/04/2017  6:02 PM EST ----- Regarding: RE: Cpap We could try a nasal mask if he would like  Traci ----- Message ----- From: Reesa Cheworothea G Floreen Teegarden, CMA Sent: 02/04/2017   5:17 PM To: Quintella Reichertraci R Turner, MD Subject: Cpap                                           Hello Dr Mayford Knifeurner, the patient is complaining of having scabs inside his nose and on the outside of his nose. He has been on his machine 2 weeks using the nasal pillows and his nose is very sore. He is planning on going back to the full mask he used for his sleep study to see if that helps. He feels like he slept better without this unit because He wakes up frequently during the night and he has been taking naps during the day now. Please advise

## 2017-02-05 NOTE — Telephone Encounter (Signed)
Yes, he would like to try the nasal mask because he tried the full mask he got from his sleep study and he was not able to get the mask to seal resulting in getting no sleep last night

## 2017-02-22 DIAGNOSIS — G4733 Obstructive sleep apnea (adult) (pediatric): Secondary | ICD-10-CM | POA: Diagnosis not present

## 2017-03-17 ENCOUNTER — Encounter: Payer: Self-pay | Admitting: Cardiology

## 2017-03-22 DIAGNOSIS — G4733 Obstructive sleep apnea (adult) (pediatric): Secondary | ICD-10-CM | POA: Diagnosis not present

## 2017-03-23 ENCOUNTER — Encounter: Payer: Self-pay | Admitting: Cardiology

## 2017-03-26 ENCOUNTER — Encounter (INDEPENDENT_AMBULATORY_CARE_PROVIDER_SITE_OTHER): Payer: Self-pay

## 2017-03-26 ENCOUNTER — Ambulatory Visit: Payer: PPO | Admitting: Cardiology

## 2017-03-26 ENCOUNTER — Encounter: Payer: Self-pay | Admitting: Cardiology

## 2017-03-26 ENCOUNTER — Ambulatory Visit (INDEPENDENT_AMBULATORY_CARE_PROVIDER_SITE_OTHER): Payer: PPO | Admitting: Cardiology

## 2017-03-26 VITALS — BP 118/60 | HR 105 | Ht 71.0 in | Wt 278.0 lb

## 2017-03-26 DIAGNOSIS — I77810 Thoracic aortic ectasia: Secondary | ICD-10-CM | POA: Diagnosis not present

## 2017-03-26 DIAGNOSIS — G4733 Obstructive sleep apnea (adult) (pediatric): Secondary | ICD-10-CM

## 2017-03-26 DIAGNOSIS — I700 Atherosclerosis of aorta: Secondary | ICD-10-CM | POA: Diagnosis not present

## 2017-03-26 DIAGNOSIS — N529 Male erectile dysfunction, unspecified: Secondary | ICD-10-CM | POA: Diagnosis not present

## 2017-03-26 DIAGNOSIS — Z1211 Encounter for screening for malignant neoplasm of colon: Secondary | ICD-10-CM | POA: Diagnosis not present

## 2017-03-26 DIAGNOSIS — Z1159 Encounter for screening for other viral diseases: Secondary | ICD-10-CM | POA: Diagnosis not present

## 2017-03-26 DIAGNOSIS — Z0000 Encounter for general adult medical examination without abnormal findings: Secondary | ICD-10-CM | POA: Diagnosis not present

## 2017-03-26 DIAGNOSIS — Z23 Encounter for immunization: Secondary | ICD-10-CM | POA: Diagnosis not present

## 2017-03-26 DIAGNOSIS — I2510 Atherosclerotic heart disease of native coronary artery without angina pectoris: Secondary | ICD-10-CM | POA: Diagnosis not present

## 2017-03-26 DIAGNOSIS — I10 Essential (primary) hypertension: Secondary | ICD-10-CM

## 2017-03-26 DIAGNOSIS — E559 Vitamin D deficiency, unspecified: Secondary | ICD-10-CM | POA: Diagnosis not present

## 2017-03-26 DIAGNOSIS — M179 Osteoarthritis of knee, unspecified: Secondary | ICD-10-CM | POA: Diagnosis not present

## 2017-03-26 DIAGNOSIS — E669 Obesity, unspecified: Secondary | ICD-10-CM | POA: Diagnosis not present

## 2017-03-26 DIAGNOSIS — E1142 Type 2 diabetes mellitus with diabetic polyneuropathy: Secondary | ICD-10-CM | POA: Diagnosis not present

## 2017-03-26 DIAGNOSIS — Z125 Encounter for screening for malignant neoplasm of prostate: Secondary | ICD-10-CM | POA: Diagnosis not present

## 2017-03-26 HISTORY — DX: Obesity, unspecified: E66.9

## 2017-03-26 HISTORY — DX: Obstructive sleep apnea (adult) (pediatric): G47.33

## 2017-03-26 NOTE — Patient Instructions (Signed)

## 2017-03-26 NOTE — Progress Notes (Signed)
Cardiology Office Note    Date:  03/26/2017   ID:  Buel, Molder May 04, 1946, MRN [id number]  PCP:  Joycelyn Rua, MD  Cardiologist:  Armanda Magic, MD   Chief Complaint  Patient presents with  . Hypertension  . Sleep Apnea    History of Present Illness:  Clarence Estrada is a 71 y.o. male with a history of HTN, mildly dilated aortic root at 39mm by echo 2017, PACs, PVCs and DM  He was seen by me for evaluation of atypical CP and fatigue and Nuclear stress test showed no ischemia and echo showed normal LVF.  He subsequently underwent sleep study which showed mild OSA with an AHI of 7.4/hr and oxygen desaturations as low as 87%.  He underwent CPAP titration to 8cm H2O.  He is now back for followup of his OSA.  He is doing well with his CPAP device.  He was initially on the nasal pillow mask but it gave him ulcers in his nose and he switched back tot he full face mask he got at the sleep lab which he likes much better.  He feels the pressure is adequate.  Since going on CPAP, he feels more rested in the am and has less daytime sleepiness.  He still feels fatigued but no sleepiness.  He does not know if he is still  Snoring.    He has some mouth dryness which is improved with drinking water.    Past Medical History:  Diagnosis Date  . Benign essential HTN 09/04/2016  . Diabetes mellitus without complication (HCC)   . Dilated aortic root (HCC)    38mm by echo 2017  . Obesity (BMI 30-39.9) 03/26/2017  . OSA (obstructive sleep apnea) 03/26/2017   mild with AHI 7.4/hr and now on CPAP at 8cm H2O.     No past surgical history on file.  Current Medications: Current Meds  Medication Sig  . allopurinol (ZYLOPRIM) 300 MG tablet Take 300 mg by mouth daily.  . APPLE CIDER VINEGAR PO Take by mouth as directed.  . Astragalus Extract POWD 200 mg by Does not apply route as directed.  . benazepril (LOTENSIN) 5 MG tablet Take 5 mg by mouth daily.  . Ginger, Zingiber officinalis, (GINGER ROOT) 500  MG CAPS Take by mouth as directed.  Marland Kitchen ibuprofen (ADVIL,MOTRIN) 800 MG tablet Take 1 tablet (800 mg total) by mouth 3 (three) times daily.  . insulin aspart (NOVOLOG) 100 UNIT/ML injection Inject into the skin as needed for high blood sugar (sliding scale as needed).  . insulin glargine (LANTUS) 100 UNIT/ML injection Inject 50 Units into the skin daily.  Marland Kitchen MAGNESIUM PO Take by mouth as directed.  . metFORMIN (GLUMETZA) 1000 MG (MOD) 24 hr tablet Take 1,000 mg by mouth 2 (two) times daily with a meal.  . oxyCODONE (OXYCONTIN) 15 mg 12 hr tablet Take 15 mg by mouth every 12 (twelve) hours.  . tamsulosin (FLOMAX) 0.4 MG CAPS Take 0.4 mg by mouth.  . TURMERIC PO Take by mouth as directed.  . [DISCONTINUED] canagliflozin (INVOKANA) 100 MG TABS tablet Take 100 mg by mouth daily before breakfast.  . [DISCONTINUED] gabapentin (NEURONTIN) 100 MG capsule Take 100 mg by mouth 3 (three) times daily.    Allergies:   Hydrocortisone; Sulfa antibiotics; and Cortisone acetate [cortisone]   Social History   Social History  . Marital status: Married    Spouse name: N/A  . Number of children: N/A  . Years of education:  N/A   Social History Main Topics  . Smoking status: Former Smoker    Quit date: 09/04/1988  . Smokeless tobacco: Never Used  . Alcohol use Yes     Comment: social  . Drug use: No  . Sexual activity: Not Asked   Other Topics Concern  . None   Social History Narrative  . None     Family History:  The patient's family history includes CAD in his sister; Heart disease in his sister; Hodgkin's lymphoma in his sister; Polymyositis in his father.   ROS:   Please see the history of present illness.    ROS All other systems reviewed and are negative.  No flowsheet data found.     PHYSICAL EXAM:   VS:  BP 118/60   Pulse (!) 105   Ht 5\' 11"  (1.803 m)   Wt 278 lb (126.1 kg)   SpO2 97%   BMI 38.77 kg/m    GEN: Well nourished, well developed, in no acute distress  HEENT: normal    Neck: no JVD, carotid bruits, or masses Cardiac: RRR; no murmurs, rubs, or gallops,no edema.  Intact distal pulses bilaterally.  Respiratory:  clear to auscultation bilaterally, normal work of breathing GI: soft, nontender, nondistended, + BS MS: no deformity or atrophy  Skin: warm and dry, no rash Neuro:  Alert and Oriented x 3, Strength and sensation are intact Psych: euthymic mood, full affect  Wt Readings from Last 3 Encounters:  03/26/17 278 lb (126.1 kg)  12/16/16 270 lb (122.5 kg)  10/26/16 270 lb (122.5 kg)      Studies/Labs Reviewed:   EKG:  EKG is not ordered today.    Recent Labs: 09/05/2016: ALT 45; BUN 12; Creatinine, Ser 1.06; Hemoglobin 16.0; Platelets 206; Potassium 4.4; Sodium 138; TSH 2.77   Lipid Panel No results found for: CHOL, TRIG, HDL, CHOLHDL, VLDL, LDLCALC, LDLDIRECT  Additional studies/ records that were reviewed today include:  CPAP download    ASSESSMENT:    1. OSA (obstructive sleep apnea)   2. Dilated aortic root (HCC)   3. Benign essential HTN   4. Obesity (BMI 30-39.9)      PLAN:  In order of problems listed above:  1.   OSA - the patient is tolerating PAP therapy well without any problems. The PAP download was reviewed today and showed an AHI of 1.1/hr on 8 cm H2O with 100% compliance in using more than 4 hours nightly.  The patient has been using and benefiting from CPAP use and will continue to benefit from therapy. I encouraged him to avoid drinking ETOH prior to bedtime.  I also encouraged him to try to avoid sleeping supine.    2.   Dilated aortic root - 39mm by echo 2017.  Will repeat echo to assess for progression on 08/2017.  3.   HTN - BP controlled on current meds.  He will continue on ACE I.  4.   Obesity - he says that he is too fatigued (no sleepy) to exercise and is being referred to an endocrinologist.     Medication Adjustments/Labs and Tests Ordered: Current medicines are reviewed at length with the patient  today.  Concerns regarding medicines are outlined above.  Medication changes, Labs and Tests ordered today are listed in the Patient Instructions below.  There are no Patient Instructions on file for this visit.   Signed, Armanda Magicraci Mukhtar Shams, MD  03/26/2017 2:51 PM    [facility] [address], [place], [place]  [address] Phone: [phone]; Fax: [phone]

## 2017-04-22 DIAGNOSIS — G4733 Obstructive sleep apnea (adult) (pediatric): Secondary | ICD-10-CM | POA: Diagnosis not present

## 2017-05-04 DIAGNOSIS — G4733 Obstructive sleep apnea (adult) (pediatric): Secondary | ICD-10-CM | POA: Diagnosis not present

## 2017-05-11 DIAGNOSIS — G4733 Obstructive sleep apnea (adult) (pediatric): Secondary | ICD-10-CM | POA: Diagnosis not present

## 2017-05-22 DIAGNOSIS — G4733 Obstructive sleep apnea (adult) (pediatric): Secondary | ICD-10-CM | POA: Diagnosis not present

## 2017-06-04 DIAGNOSIS — Z794 Long term (current) use of insulin: Secondary | ICD-10-CM | POA: Diagnosis not present

## 2017-06-04 DIAGNOSIS — Z5181 Encounter for therapeutic drug level monitoring: Secondary | ICD-10-CM | POA: Diagnosis not present

## 2017-06-04 DIAGNOSIS — R195 Other fecal abnormalities: Secondary | ICD-10-CM | POA: Diagnosis not present

## 2017-06-04 DIAGNOSIS — M6281 Muscle weakness (generalized): Secondary | ICD-10-CM | POA: Diagnosis not present

## 2017-06-04 DIAGNOSIS — Z79899 Other long term (current) drug therapy: Secondary | ICD-10-CM | POA: Diagnosis not present

## 2017-06-04 DIAGNOSIS — E1165 Type 2 diabetes mellitus with hyperglycemia: Secondary | ICD-10-CM | POA: Diagnosis not present

## 2017-06-04 DIAGNOSIS — I2510 Atherosclerotic heart disease of native coronary artery without angina pectoris: Secondary | ICD-10-CM | POA: Diagnosis not present

## 2017-06-04 DIAGNOSIS — E1142 Type 2 diabetes mellitus with diabetic polyneuropathy: Secondary | ICD-10-CM | POA: Diagnosis not present

## 2017-06-04 DIAGNOSIS — M791 Myalgia: Secondary | ICD-10-CM | POA: Diagnosis not present

## 2017-06-04 DIAGNOSIS — E786 Lipoprotein deficiency: Secondary | ICD-10-CM | POA: Diagnosis not present

## 2017-06-22 DIAGNOSIS — G4733 Obstructive sleep apnea (adult) (pediatric): Secondary | ICD-10-CM | POA: Diagnosis not present

## 2017-07-09 DIAGNOSIS — E1142 Type 2 diabetes mellitus with diabetic polyneuropathy: Secondary | ICD-10-CM | POA: Diagnosis not present

## 2017-07-09 DIAGNOSIS — Z0000 Encounter for general adult medical examination without abnormal findings: Secondary | ICD-10-CM | POA: Diagnosis not present

## 2017-07-09 DIAGNOSIS — Z794 Long term (current) use of insulin: Secondary | ICD-10-CM | POA: Diagnosis not present

## 2017-07-09 DIAGNOSIS — M179 Osteoarthritis of knee, unspecified: Secondary | ICD-10-CM | POA: Diagnosis not present

## 2017-07-22 DIAGNOSIS — G4733 Obstructive sleep apnea (adult) (pediatric): Secondary | ICD-10-CM | POA: Diagnosis not present

## 2017-08-12 DIAGNOSIS — G4733 Obstructive sleep apnea (adult) (pediatric): Secondary | ICD-10-CM | POA: Diagnosis not present

## 2017-08-22 DIAGNOSIS — G4733 Obstructive sleep apnea (adult) (pediatric): Secondary | ICD-10-CM | POA: Diagnosis not present

## 2017-08-24 ENCOUNTER — Telehealth (HOSPITAL_COMMUNITY): Payer: Self-pay | Admitting: Cardiology

## 2017-08-24 NOTE — Telephone Encounter (Signed)
I called pt and spoke with him about rescheduling his appt that was cancelled for 09/21/17. He stated that at this time he can not afford to pay for this test and he would call back in about 6 months.   Patient will be removed from workqueue.

## 2017-09-09 DIAGNOSIS — I2510 Atherosclerotic heart disease of native coronary artery without angina pectoris: Secondary | ICD-10-CM | POA: Diagnosis not present

## 2017-09-09 DIAGNOSIS — Z5181 Encounter for therapeutic drug level monitoring: Secondary | ICD-10-CM | POA: Diagnosis not present

## 2017-09-09 DIAGNOSIS — E1165 Type 2 diabetes mellitus with hyperglycemia: Secondary | ICD-10-CM | POA: Diagnosis not present

## 2017-09-09 DIAGNOSIS — Z6841 Body Mass Index (BMI) 40.0 and over, adult: Secondary | ICD-10-CM | POA: Diagnosis not present

## 2017-09-09 DIAGNOSIS — E786 Lipoprotein deficiency: Secondary | ICD-10-CM | POA: Diagnosis not present

## 2017-09-09 DIAGNOSIS — Z794 Long term (current) use of insulin: Secondary | ICD-10-CM | POA: Diagnosis not present

## 2017-09-18 DIAGNOSIS — M170 Bilateral primary osteoarthritis of knee: Secondary | ICD-10-CM | POA: Diagnosis not present

## 2017-09-18 DIAGNOSIS — M1711 Unilateral primary osteoarthritis, right knee: Secondary | ICD-10-CM | POA: Diagnosis not present

## 2017-09-18 DIAGNOSIS — M1712 Unilateral primary osteoarthritis, left knee: Secondary | ICD-10-CM | POA: Diagnosis not present

## 2017-09-21 ENCOUNTER — Other Ambulatory Visit (HOSPITAL_COMMUNITY): Payer: PPO

## 2017-09-22 DIAGNOSIS — G4733 Obstructive sleep apnea (adult) (pediatric): Secondary | ICD-10-CM | POA: Diagnosis not present

## 2017-10-21 DIAGNOSIS — T148XXA Other injury of unspecified body region, initial encounter: Secondary | ICD-10-CM | POA: Diagnosis not present

## 2017-10-22 DIAGNOSIS — G4733 Obstructive sleep apnea (adult) (pediatric): Secondary | ICD-10-CM | POA: Diagnosis not present

## 2017-11-22 DIAGNOSIS — G4733 Obstructive sleep apnea (adult) (pediatric): Secondary | ICD-10-CM | POA: Diagnosis not present

## 2017-12-18 DIAGNOSIS — M1711 Unilateral primary osteoarthritis, right knee: Secondary | ICD-10-CM | POA: Diagnosis not present

## 2017-12-22 DIAGNOSIS — G4733 Obstructive sleep apnea (adult) (pediatric): Secondary | ICD-10-CM | POA: Diagnosis not present

## 2018-01-07 DIAGNOSIS — E786 Lipoprotein deficiency: Secondary | ICD-10-CM | POA: Diagnosis not present

## 2018-01-07 DIAGNOSIS — Z5181 Encounter for therapeutic drug level monitoring: Secondary | ICD-10-CM | POA: Diagnosis not present

## 2018-01-07 DIAGNOSIS — Z79899 Other long term (current) drug therapy: Secondary | ICD-10-CM | POA: Diagnosis not present

## 2018-01-07 DIAGNOSIS — I2510 Atherosclerotic heart disease of native coronary artery without angina pectoris: Secondary | ICD-10-CM | POA: Diagnosis not present

## 2018-01-07 DIAGNOSIS — Z794 Long term (current) use of insulin: Secondary | ICD-10-CM | POA: Diagnosis not present

## 2018-01-07 DIAGNOSIS — E1165 Type 2 diabetes mellitus with hyperglycemia: Secondary | ICD-10-CM | POA: Diagnosis not present

## 2018-01-07 DIAGNOSIS — Z6841 Body Mass Index (BMI) 40.0 and over, adult: Secondary | ICD-10-CM | POA: Diagnosis not present

## 2018-01-22 DIAGNOSIS — G4733 Obstructive sleep apnea (adult) (pediatric): Secondary | ICD-10-CM | POA: Diagnosis not present

## 2018-01-25 DIAGNOSIS — Z7984 Long term (current) use of oral hypoglycemic drugs: Secondary | ICD-10-CM | POA: Diagnosis not present

## 2018-01-25 DIAGNOSIS — E1165 Type 2 diabetes mellitus with hyperglycemia: Secondary | ICD-10-CM | POA: Diagnosis not present

## 2018-03-11 DIAGNOSIS — M1712 Unilateral primary osteoarthritis, left knee: Secondary | ICD-10-CM | POA: Diagnosis not present

## 2018-03-15 DIAGNOSIS — R350 Frequency of micturition: Secondary | ICD-10-CM | POA: Diagnosis not present

## 2018-03-15 DIAGNOSIS — I10 Essential (primary) hypertension: Secondary | ICD-10-CM | POA: Diagnosis not present

## 2018-03-15 DIAGNOSIS — M1A0711 Idiopathic chronic gout, right ankle and foot, with tophus (tophi): Secondary | ICD-10-CM | POA: Diagnosis not present

## 2018-03-15 DIAGNOSIS — G4730 Sleep apnea, unspecified: Secondary | ICD-10-CM | POA: Diagnosis not present

## 2018-03-15 DIAGNOSIS — M1711 Unilateral primary osteoarthritis, right knee: Secondary | ICD-10-CM | POA: Diagnosis not present

## 2018-03-15 DIAGNOSIS — E669 Obesity, unspecified: Secondary | ICD-10-CM | POA: Diagnosis not present

## 2018-03-15 DIAGNOSIS — E782 Mixed hyperlipidemia: Secondary | ICD-10-CM | POA: Diagnosis not present

## 2018-03-15 DIAGNOSIS — Z125 Encounter for screening for malignant neoplasm of prostate: Secondary | ICD-10-CM | POA: Diagnosis not present

## 2018-03-15 DIAGNOSIS — N401 Enlarged prostate with lower urinary tract symptoms: Secondary | ICD-10-CM | POA: Diagnosis not present

## 2018-03-15 DIAGNOSIS — M1712 Unilateral primary osteoarthritis, left knee: Secondary | ICD-10-CM | POA: Diagnosis not present

## 2018-03-15 DIAGNOSIS — J069 Acute upper respiratory infection, unspecified: Secondary | ICD-10-CM | POA: Diagnosis not present

## 2018-03-15 DIAGNOSIS — E1169 Type 2 diabetes mellitus with other specified complication: Secondary | ICD-10-CM | POA: Diagnosis not present

## 2018-03-23 DIAGNOSIS — E1169 Type 2 diabetes mellitus with other specified complication: Secondary | ICD-10-CM | POA: Diagnosis not present

## 2018-03-23 DIAGNOSIS — E782 Mixed hyperlipidemia: Secondary | ICD-10-CM | POA: Diagnosis not present

## 2018-03-23 DIAGNOSIS — Z79899 Other long term (current) drug therapy: Secondary | ICD-10-CM | POA: Diagnosis not present

## 2018-03-23 DIAGNOSIS — I10 Essential (primary) hypertension: Secondary | ICD-10-CM | POA: Diagnosis not present

## 2018-03-23 DIAGNOSIS — E669 Obesity, unspecified: Secondary | ICD-10-CM | POA: Diagnosis not present

## 2018-03-23 DIAGNOSIS — N401 Enlarged prostate with lower urinary tract symptoms: Secondary | ICD-10-CM | POA: Diagnosis not present

## 2018-03-23 DIAGNOSIS — Z125 Encounter for screening for malignant neoplasm of prostate: Secondary | ICD-10-CM | POA: Diagnosis not present

## 2018-04-23 DIAGNOSIS — E786 Lipoprotein deficiency: Secondary | ICD-10-CM | POA: Diagnosis not present

## 2018-04-23 DIAGNOSIS — Z794 Long term (current) use of insulin: Secondary | ICD-10-CM | POA: Diagnosis not present

## 2018-04-23 DIAGNOSIS — E1165 Type 2 diabetes mellitus with hyperglycemia: Secondary | ICD-10-CM | POA: Diagnosis not present

## 2018-04-23 DIAGNOSIS — I2510 Atherosclerotic heart disease of native coronary artery without angina pectoris: Secondary | ICD-10-CM | POA: Diagnosis not present

## 2018-04-23 DIAGNOSIS — Z5181 Encounter for therapeutic drug level monitoring: Secondary | ICD-10-CM | POA: Diagnosis not present

## 2018-04-29 DIAGNOSIS — H40033 Anatomical narrow angle, bilateral: Secondary | ICD-10-CM | POA: Diagnosis not present

## 2018-04-29 DIAGNOSIS — J0110 Acute frontal sinusitis, unspecified: Secondary | ICD-10-CM | POA: Diagnosis not present

## 2018-04-29 DIAGNOSIS — E119 Type 2 diabetes mellitus without complications: Secondary | ICD-10-CM | POA: Diagnosis not present

## 2018-07-13 DIAGNOSIS — E1140 Type 2 diabetes mellitus with diabetic neuropathy, unspecified: Secondary | ICD-10-CM | POA: Diagnosis not present

## 2018-07-13 DIAGNOSIS — E1169 Type 2 diabetes mellitus with other specified complication: Secondary | ICD-10-CM | POA: Diagnosis not present

## 2018-07-13 DIAGNOSIS — M1712 Unilateral primary osteoarthritis, left knee: Secondary | ICD-10-CM | POA: Diagnosis not present

## 2018-07-13 DIAGNOSIS — M1711 Unilateral primary osteoarthritis, right knee: Secondary | ICD-10-CM | POA: Diagnosis not present

## 2018-07-13 DIAGNOSIS — N401 Enlarged prostate with lower urinary tract symptoms: Secondary | ICD-10-CM | POA: Diagnosis not present

## 2018-07-13 DIAGNOSIS — I10 Essential (primary) hypertension: Secondary | ICD-10-CM | POA: Diagnosis not present

## 2018-07-13 DIAGNOSIS — G4730 Sleep apnea, unspecified: Secondary | ICD-10-CM | POA: Diagnosis not present

## 2018-07-13 DIAGNOSIS — M5136 Other intervertebral disc degeneration, lumbar region: Secondary | ICD-10-CM | POA: Diagnosis not present

## 2018-07-13 DIAGNOSIS — E782 Mixed hyperlipidemia: Secondary | ICD-10-CM | POA: Diagnosis not present

## 2018-07-13 DIAGNOSIS — E669 Obesity, unspecified: Secondary | ICD-10-CM | POA: Diagnosis not present

## 2018-07-27 DIAGNOSIS — E786 Lipoprotein deficiency: Secondary | ICD-10-CM | POA: Diagnosis not present

## 2018-07-27 DIAGNOSIS — I2510 Atherosclerotic heart disease of native coronary artery without angina pectoris: Secondary | ICD-10-CM | POA: Diagnosis not present

## 2018-07-27 DIAGNOSIS — Z79899 Other long term (current) drug therapy: Secondary | ICD-10-CM | POA: Diagnosis not present

## 2018-07-27 DIAGNOSIS — Z794 Long term (current) use of insulin: Secondary | ICD-10-CM | POA: Diagnosis not present

## 2018-07-27 DIAGNOSIS — E538 Deficiency of other specified B group vitamins: Secondary | ICD-10-CM | POA: Diagnosis not present

## 2018-07-27 DIAGNOSIS — E119 Type 2 diabetes mellitus without complications: Secondary | ICD-10-CM | POA: Diagnosis not present

## 2018-09-22 DIAGNOSIS — G4733 Obstructive sleep apnea (adult) (pediatric): Secondary | ICD-10-CM | POA: Diagnosis not present

## 2018-09-30 ENCOUNTER — Other Ambulatory Visit: Payer: Self-pay

## 2018-09-30 NOTE — Patient Outreach (Signed)
[facility]) Care Management  09/30/2018  Clarence Estrada Nov 04, 1946 [id number]  TELEPHONE SCREENING Referral date: 09/21/18 Referral source: HTA referral  Referral reason: medication assistance Insurance: HTA Attempt #1  Telephone call to patient regarding referral. Unable to reach patient. HIPAA compliant voice message left with call back phone number.   PLAN: RNCM will attempt 2nd telephone call to patient within 4 business days. RNCM will send outreach letter.   George Ina RN,BSN, CCM [facility] Telephonic  [phone]

## 2018-10-05 ENCOUNTER — Other Ambulatory Visit: Payer: Self-pay

## 2018-10-05 NOTE — Patient Outreach (Signed)
[facility]) Care Management  10/05/2018  Clarence Estrada [birth year] [id number]  TELEPHONE SCREENING Referral date: 09/21/18 Referral source: HTA referral  Referral reason: medication assistance Insurance: HTA  Telephone call to patient regarding HTA referral. HIPAA verified with patient. Explained reason for call.  Patient states he is currently taking Novolin insulin.  Patient states he thinks he is past the donut hole now and is in catastrophic as far as his medication coverage. Patient states he is unsure at this time whether he needs assistance for his medication.  Patient states if he is in "catastrophic" he may not need assistance. Patient states he will not know this until his next refill.   RNCM discussed [facility] care management services. RNCM offered to send [facility] care management brochure to patient. Advised patient to call [facility] care management or his Health team concierge when he determines whether he will need medication assistance.  Patient verbalized understanding. Patient denied any further needs.  Plan; RNCM will close patient due to refusal of services.  RNCM will send patient [facility] care management brochure / magnet RNCM will send patients primary MD closure notification   George Ina RN,BSN,CCM [facility] Telephonic  [phone]

## 2018-10-20 DIAGNOSIS — E786 Lipoprotein deficiency: Secondary | ICD-10-CM | POA: Diagnosis not present

## 2018-10-20 DIAGNOSIS — Z5181 Encounter for therapeutic drug level monitoring: Secondary | ICD-10-CM | POA: Diagnosis not present

## 2018-10-20 DIAGNOSIS — E119 Type 2 diabetes mellitus without complications: Secondary | ICD-10-CM | POA: Diagnosis not present

## 2018-10-20 DIAGNOSIS — E538 Deficiency of other specified B group vitamins: Secondary | ICD-10-CM | POA: Diagnosis not present

## 2018-10-20 DIAGNOSIS — I2510 Atherosclerotic heart disease of native coronary artery without angina pectoris: Secondary | ICD-10-CM | POA: Diagnosis not present

## 2018-10-20 DIAGNOSIS — Z794 Long term (current) use of insulin: Secondary | ICD-10-CM | POA: Diagnosis not present

## 2018-11-08 DIAGNOSIS — M25562 Pain in left knee: Secondary | ICD-10-CM | POA: Diagnosis not present

## 2018-11-08 DIAGNOSIS — M1712 Unilateral primary osteoarthritis, left knee: Secondary | ICD-10-CM | POA: Diagnosis not present

## 2018-11-15 DIAGNOSIS — M1712 Unilateral primary osteoarthritis, left knee: Secondary | ICD-10-CM | POA: Diagnosis not present

## 2018-11-22 DIAGNOSIS — M1712 Unilateral primary osteoarthritis, left knee: Secondary | ICD-10-CM | POA: Diagnosis not present

## 2019-01-19 DIAGNOSIS — E1140 Type 2 diabetes mellitus with diabetic neuropathy, unspecified: Secondary | ICD-10-CM | POA: Diagnosis not present

## 2019-01-19 DIAGNOSIS — L239 Allergic contact dermatitis, unspecified cause: Secondary | ICD-10-CM | POA: Diagnosis not present

## 2019-01-19 DIAGNOSIS — G4730 Sleep apnea, unspecified: Secondary | ICD-10-CM | POA: Diagnosis not present

## 2019-01-19 DIAGNOSIS — I10 Essential (primary) hypertension: Secondary | ICD-10-CM | POA: Diagnosis not present

## 2019-01-19 DIAGNOSIS — E1169 Type 2 diabetes mellitus with other specified complication: Secondary | ICD-10-CM | POA: Diagnosis not present

## 2019-01-19 DIAGNOSIS — N401 Enlarged prostate with lower urinary tract symptoms: Secondary | ICD-10-CM | POA: Diagnosis not present

## 2019-01-19 DIAGNOSIS — N529 Male erectile dysfunction, unspecified: Secondary | ICD-10-CM | POA: Diagnosis not present

## 2019-01-19 DIAGNOSIS — M109 Gout, unspecified: Secondary | ICD-10-CM | POA: Diagnosis not present

## 2019-01-19 DIAGNOSIS — E669 Obesity, unspecified: Secondary | ICD-10-CM | POA: Diagnosis not present

## 2019-01-19 DIAGNOSIS — M1712 Unilateral primary osteoarthritis, left knee: Secondary | ICD-10-CM | POA: Diagnosis not present

## 2019-02-02 DIAGNOSIS — Z5181 Encounter for therapeutic drug level monitoring: Secondary | ICD-10-CM | POA: Diagnosis not present

## 2019-02-02 DIAGNOSIS — I2510 Atherosclerotic heart disease of native coronary artery without angina pectoris: Secondary | ICD-10-CM | POA: Diagnosis not present

## 2019-02-02 DIAGNOSIS — E786 Lipoprotein deficiency: Secondary | ICD-10-CM | POA: Diagnosis not present

## 2019-02-02 DIAGNOSIS — Z794 Long term (current) use of insulin: Secondary | ICD-10-CM | POA: Diagnosis not present

## 2019-02-02 DIAGNOSIS — E538 Deficiency of other specified B group vitamins: Secondary | ICD-10-CM | POA: Diagnosis not present

## 2019-02-02 DIAGNOSIS — E119 Type 2 diabetes mellitus without complications: Secondary | ICD-10-CM | POA: Diagnosis not present

## 2019-02-07 DIAGNOSIS — E119 Type 2 diabetes mellitus without complications: Secondary | ICD-10-CM | POA: Diagnosis not present

## 2019-05-25 DIAGNOSIS — M1712 Unilateral primary osteoarthritis, left knee: Secondary | ICD-10-CM | POA: Diagnosis not present

## 2019-07-29 DIAGNOSIS — M25561 Pain in right knee: Secondary | ICD-10-CM | POA: Diagnosis not present

## 2019-07-29 DIAGNOSIS — M1712 Unilateral primary osteoarthritis, left knee: Secondary | ICD-10-CM | POA: Diagnosis not present

## 2019-07-29 DIAGNOSIS — M25562 Pain in left knee: Secondary | ICD-10-CM | POA: Diagnosis not present

## 2019-08-12 DIAGNOSIS — M109 Gout, unspecified: Secondary | ICD-10-CM | POA: Diagnosis not present

## 2019-08-12 DIAGNOSIS — I10 Essential (primary) hypertension: Secondary | ICD-10-CM | POA: Diagnosis not present

## 2019-08-12 DIAGNOSIS — M1712 Unilateral primary osteoarthritis, left knee: Secondary | ICD-10-CM | POA: Diagnosis not present

## 2019-08-12 DIAGNOSIS — N529 Male erectile dysfunction, unspecified: Secondary | ICD-10-CM | POA: Diagnosis not present

## 2019-08-12 DIAGNOSIS — G4730 Sleep apnea, unspecified: Secondary | ICD-10-CM | POA: Diagnosis not present

## 2019-08-12 DIAGNOSIS — E669 Obesity, unspecified: Secondary | ICD-10-CM | POA: Diagnosis not present

## 2019-08-12 DIAGNOSIS — E1169 Type 2 diabetes mellitus with other specified complication: Secondary | ICD-10-CM | POA: Diagnosis not present

## 2019-08-12 DIAGNOSIS — N401 Enlarged prostate with lower urinary tract symptoms: Secondary | ICD-10-CM | POA: Diagnosis not present

## 2019-08-25 DIAGNOSIS — Z794 Long term (current) use of insulin: Secondary | ICD-10-CM | POA: Diagnosis not present

## 2019-08-25 DIAGNOSIS — E119 Type 2 diabetes mellitus without complications: Secondary | ICD-10-CM | POA: Diagnosis not present

## 2019-08-25 DIAGNOSIS — I2510 Atherosclerotic heart disease of native coronary artery without angina pectoris: Secondary | ICD-10-CM | POA: Diagnosis not present

## 2019-08-25 DIAGNOSIS — E538 Deficiency of other specified B group vitamins: Secondary | ICD-10-CM | POA: Diagnosis not present

## 2019-08-25 DIAGNOSIS — E786 Lipoprotein deficiency: Secondary | ICD-10-CM | POA: Diagnosis not present

## 2019-08-25 DIAGNOSIS — Z5181 Encounter for therapeutic drug level monitoring: Secondary | ICD-10-CM | POA: Diagnosis not present

## 2019-10-03 ENCOUNTER — Ambulatory Visit: Payer: PPO | Admitting: Cardiology

## 2019-10-03 ENCOUNTER — Other Ambulatory Visit: Payer: Self-pay

## 2019-10-03 ENCOUNTER — Encounter: Payer: Self-pay | Admitting: Cardiology

## 2019-10-03 ENCOUNTER — Encounter (INDEPENDENT_AMBULATORY_CARE_PROVIDER_SITE_OTHER): Payer: Self-pay

## 2019-10-03 VITALS — BP 138/62 | HR 99 | Ht 71.0 in | Wt 312.2 lb

## 2019-10-03 DIAGNOSIS — G4733 Obstructive sleep apnea (adult) (pediatric): Secondary | ICD-10-CM | POA: Diagnosis not present

## 2019-10-03 DIAGNOSIS — I77810 Thoracic aortic ectasia: Secondary | ICD-10-CM | POA: Diagnosis not present

## 2019-10-03 DIAGNOSIS — R0602 Shortness of breath: Secondary | ICD-10-CM

## 2019-10-03 DIAGNOSIS — I10 Essential (primary) hypertension: Secondary | ICD-10-CM | POA: Diagnosis not present

## 2019-10-03 DIAGNOSIS — E669 Obesity, unspecified: Secondary | ICD-10-CM

## 2019-10-03 NOTE — Patient Instructions (Addendum)
Medication Instructions:   If you need a refill on your cardiac medications before your next appointment, please call your pharmacy.   Lab work:  If you have labs (blood work) drawn today and your tests are completely normal, you will receive your results only by: Marland Kitchen MyChart Message (if you have MyChart) OR . A paper copy in the mail If you have any lab test that is abnormal or we need to change your treatment, we will call you to review the results.  Testing/Procedures: Your physician has requested that you have an echocardiogram. Echocardiography is a painless test that uses sound waves to create images of your heart. It provides your doctor with information about the size and shape of your heart and how well your heart's chambers and valves are working. This procedure takes approximately one hour. There are no restrictions for this procedure.  Your physician has requested that you have a lexiscan myoview- 2 days. For further information please visit [email or web address]. Please follow instruction sheet, as given.  Follow-Up: At [facility], you and your health needs are our priority.  As part of our continuing mission to provide you with exceptional heart care, we have created designated Provider Care Teams.  These Care Teams include your primary Cardiologist (physician) and Advanced Practice Providers (APPs -  Physician Assistants and Nurse Practitioners) who all work together to provide you with the care you need, when you need it. You will need a follow up appointment in 1 years.  Please call our office 2 months in advance to schedule this appointment.  You may see Dr. Radford Pax or one of the following Advanced Practice Providers on your designated Care Team:   [place], PA-C Melina Copa, PA-C . Ermalinda Barrios, PA-C

## 2019-10-03 NOTE — Progress Notes (Signed)
Cardiology Office Note:    Date:  10/03/2019   ID:  Nalin, Mazzocco 1946-05-18, MRN [id number]  PCP:  Rebekah Chesterfield, NP  Cardiologist:  No primary care provider on file.    Referring MD: Joycelyn Rua, MD   Chief Complaint  Patient presents with  . Follow-up    HTN, dialted AO, PVCs, OSA    History of Present Illness:    Clarence Estrada is a 73 y.o. male with a hx of HTN, mildly dilated aortic root at 2mm by echo 2017, PACs, PVCs and DM  He was seen by me for evaluation of atypical CP and fatigue and Nuclear stress test showed no ischemia and echo showed normal LVF.  He subsequently underwent sleep study which showed mild OSA with an AHI of 7.4/hr and oxygen desaturations as low as 87%.  He underwent CPAP titration to 8cm H2O.   He is here today for followup and is doing well.  He denies any chest pain or pressure,  PND, orthopnea, LE edema, dizziness, palpitations or syncope.  He has been having increased DOE since I saw him last.  He says it occurs with any exertion when he is outside.  He has become very sedentary due to orthopedic problems with is knees and has gained weight. He is compliant with his meds and is tolerating meds with no SE.  He is doing well with his CPAP device and thinks that he has gotten used to it.  He tolerates the mask and feels the pressure is adequate.  Since going on CPAP he feels rested in the am and has no significant daytime sleepiness.  He denies any significant mouth or nasal dryness or nasal congestion.  He does not think that he snores.     Past Medical History:  Diagnosis Date  . Benign essential HTN 09/04/2016  . Diabetes mellitus without complication (HCC)   . Dilated aortic root (HCC)    6mm by echo 2017  . Obesity (BMI 30-39.9) 03/26/2017  . OSA (obstructive sleep apnea) 03/26/2017   mild with AHI 7.4/hr and now on CPAP at 8cm H2O.     History reviewed. No pertinent surgical history.  Current Medications: Current Meds  Medication  Sig  . allopurinol (ZYLOPRIM) 300 MG tablet Take 300 mg by mouth daily.  . APPLE CIDER VINEGAR PO Take by mouth as directed.  . benazepril (LOTENSIN) 5 MG tablet Take 5 mg by mouth daily.  . Ginger, Zingiber officinalis, (GINGER ROOT) 500 MG CAPS Take by mouth as directed.  Marland Kitchen ibuprofen (ADVIL,MOTRIN) 800 MG tablet Take 1 tablet (800 mg total) by mouth 3 (three) times daily.  . insulin glargine (LANTUS) 100 UNIT/ML injection Inject 50 Units into the skin daily.  Marland Kitchen MAGNESIUM PO Take by mouth as directed.  Marland Kitchen NOVOLIN 70/30 (70-30) 100 UNIT/ML injection Inject into the skin 2 (two) times daily with a meal. 80 units morning and 70 at night  . oxyCODONE (OXYCONTIN) 15 mg 12 hr tablet Take 15 mg by mouth every 12 (twelve) hours.  . tamsulosin (FLOMAX) 0.4 MG CAPS Take 0.4 mg by mouth.  . TRAMADOL HCL PO Take by mouth as directed.  . TURMERIC PO Take by mouth as directed.     Allergies:   Hydrocortisone, Insulin detemir, Sulfa antibiotics, and Cortisone acetate [cortisone]   Social History   Socioeconomic History  . Marital status: Married    Spouse name: Not on file  . Number of children: Not on file  .  Years of education: Not on file  . Highest education level: Not on file  Occupational History  . Not on file  Social Needs  . Financial resource strain: Not on file  . Food insecurity    Worry: Not on file    Inability: Not on file  . Transportation needs    Medical: Not on file    Non-medical: Not on file  Tobacco Use  . Smoking status: Former Smoker    Quit date: 09/04/1988    Years since quitting: 31.0  . Smokeless tobacco: Never Used  Substance and Sexual Activity  . Alcohol use: Yes    Comment: social  . Drug use: No  . Sexual activity: Not on file  Lifestyle  . Physical activity    Days per week: Not on file    Minutes per session: Not on file  . Stress: Not on file  Relationships  . Social Herbalist on phone: Not on file    Gets together: Not on file     Attends religious service: Not on file    Active member of club or organization: Not on file    Attends meetings of clubs or organizations: Not on file    Relationship status: Not on file  Other Topics Concern  . Not on file  Social History Narrative  . Not on file     Family History: The patient's family history includes CAD in his sister; Heart disease in his sister; Hodgkin's lymphoma in his sister; Polymyositis in his father.  ROS:   Please see the history of present illness.    ROS  All other systems reviewed and negative.   EKGs/Labs/Other Studies Reviewed:    The following studies were reviewed today: PAP compliance download  EKG:  EKG is not  ordered today.    Recent Labs: No results found for requested labs within last 8760 hours.   Recent Lipid Panel No results found for: CHOL, TRIG, HDL, CHOLHDL, VLDL, LDLCALC, LDLDIRECT  Physical Exam:    VS:  BP 138/62   Pulse 99   Ht 5\' 11"  (1.803 m)   Wt (!) 312 lb 3.2 oz (141.6 kg)   SpO2 97%   BMI 43.54 kg/m     Wt Readings from Last 3 Encounters:  10/03/19 (!) 312 lb 3.2 oz (141.6 kg)  03/26/17 278 lb (126.1 kg)  12/16/16 270 lb (122.5 kg)     GEN:  Well nourished, well developed in no acute distress HEENT: Normal NECK: No JVD; No carotid bruits LYMPHATICS: No lymphadenopathy CARDIAC: RRR, no murmurs, rubs, gallops RESPIRATORY:  Clear to auscultation without rales, wheezing or rhonchi  ABDOMEN: Soft, non-tender, non-distended MUSCULOSKELETAL:  No edema; No deformity  SKIN: Warm and dry NEUROLOGIC:  Alert and oriented x 3 PSYCHIATRIC:  Normal affect   ASSESSMENT:    1. OSA (obstructive sleep apnea)   2. Benign essential HTN   3. Dilated aortic root (HCC)   4. Obesity (BMI 30-39.9)    PLAN:    In order of problems listed above:  1.  OSA -The patient is tolerating PAP therapy well without any problems. The PAP download was reviewed today and showed an AHI of 1.2/hr on 8 cm H2O with 100% compliance  in using more than 4 hours nightly.  The patient has been using and benefiting from PAP use and will continue to benefit from therapy.   2.  HTN -BP controlled on exam -continue Benazeptril 5mg  daily -I  will get most recent labs from PCP  3.  Dilated aortic root -39mm by echo 2017 -repeat echo to followup  4.  Obesity  -I have encouraged him to get into a routine exercise program and cut back on carbs and portions.  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  DOE -likely related to obesity and deconditionin -I will repeat echo to reassess LVF and Lexiscan myoview to rule out ischemia   Medication Adjustments/Labs and Tests Ordered: Current medicines are reviewed at length with the patient today.  Concerns regarding medicines are outlined above.  No orders of the defined types were placed in this encounter.  No orders of the defined types were placed in this encounter.   Signed, Armanda Magicraci , MD  10/03/2019 9:24 AM    [facility]

## 2019-10-07 DIAGNOSIS — G4733 Obstructive sleep apnea (adult) (pediatric): Secondary | ICD-10-CM | POA: Diagnosis not present

## 2019-10-12 ENCOUNTER — Telehealth (HOSPITAL_COMMUNITY): Payer: Self-pay | Admitting: *Deleted

## 2019-10-12 NOTE — Telephone Encounter (Signed)
Patient given detailed instructions per Myocardial Perfusion Study Information Sheet for the test on 10/17/19 at 8:15. Patient notified to arrive 15 minutes early and that it is imperative to arrive on time for appointment to keep from having the test rescheduled.  If you need to cancel or reschedule your appointment, please call the office within 24 hours of your appointment. . Patient verbalized understanding.Clarence Estrada

## 2019-10-17 ENCOUNTER — Other Ambulatory Visit: Payer: Self-pay

## 2019-10-17 ENCOUNTER — Telehealth: Payer: Self-pay

## 2019-10-17 ENCOUNTER — Ambulatory Visit (HOSPITAL_COMMUNITY): Payer: PPO | Attending: Cardiovascular Disease

## 2019-10-17 ENCOUNTER — Ambulatory Visit (HOSPITAL_BASED_OUTPATIENT_CLINIC_OR_DEPARTMENT_OTHER): Payer: PPO

## 2019-10-17 ENCOUNTER — Encounter: Payer: Self-pay | Admitting: Cardiology

## 2019-10-17 DIAGNOSIS — E669 Obesity, unspecified: Secondary | ICD-10-CM | POA: Diagnosis not present

## 2019-10-17 DIAGNOSIS — G4733 Obstructive sleep apnea (adult) (pediatric): Secondary | ICD-10-CM | POA: Diagnosis not present

## 2019-10-17 DIAGNOSIS — I10 Essential (primary) hypertension: Secondary | ICD-10-CM

## 2019-10-17 DIAGNOSIS — R0602 Shortness of breath: Secondary | ICD-10-CM | POA: Insufficient documentation

## 2019-10-17 DIAGNOSIS — I77810 Thoracic aortic ectasia: Secondary | ICD-10-CM

## 2019-10-17 LAB — ECHOCARDIOGRAM COMPLETE
Height: 71 in
Weight: 4992 oz

## 2019-10-17 MED ORDER — TECHNETIUM TC 99M TETROFOSMIN IV KIT
32.9000 | PACK | Freq: Once | INTRAVENOUS | Status: AC | PRN
  Administered 2019-10-17: 32.9 via INTRAVENOUS
  Filled 2019-10-17: qty 33

## 2019-10-17 MED ORDER — REGADENOSON 0.4 MG/5ML IV SOLN
0.4000 mg | Freq: Once | INTRAVENOUS | Status: AC
  Administered 2019-10-17: 0.4 mg via INTRAVENOUS

## 2019-10-17 NOTE — Telephone Encounter (Signed)
Notes recorded by Frederik Schmidt, RN on 10/17/2019 at 12:53 PM EDT  The patient has been notified of the Echo result and verbalized understanding. All questions (if any) were answered.  Frederik Schmidt, RN 10/17/2019 12:53 PM

## 2019-10-17 NOTE — Telephone Encounter (Signed)
-----   Message from Sueanne Margarita, MD sent at 10/17/2019 12:37 PM EDT ----- Echo showed normal LVF, mildly dilated aortic root at 56mm.  Mildly thickened and calcified AV - repeat limited echo in 1 year for dialted aortic root

## 2019-10-18 ENCOUNTER — Ambulatory Visit (HOSPITAL_COMMUNITY): Payer: PPO | Attending: Cardiovascular Disease

## 2019-10-18 LAB — MYOCARDIAL PERFUSION IMAGING
LV dias vol: 87 mL (ref 62–150)
LV sys vol: 34 mL
Peak HR: 122 {beats}/min
Rest HR: 114 {beats}/min
SDS: 0
SRS: 0
SSS: 0
TID: 0.8

## 2019-10-18 MED ORDER — TECHNETIUM TC 99M TETROFOSMIN IV KIT
31.8000 | PACK | Freq: Once | INTRAVENOUS | Status: AC | PRN
  Administered 2019-10-18: 31.8 via INTRAVENOUS
  Filled 2019-10-18: qty 32

## 2019-10-19 ENCOUNTER — Telehealth: Payer: Self-pay | Admitting: *Deleted

## 2019-10-19 NOTE — Telephone Encounter (Signed)
Pt has been notified of Myoview results by phone with verbal understanding. Pt states he still has no energy and wants to know what is causing this. I explained to the pt stress test looks good though, I will send a note to Dr. Radford Pax for further input as to not having any energy. I advised the pt we will call back once Dr. Radford Pax gets back to [place]. Pt thanked me for the call. The patient has been notified of the result and verbalized understanding.  All questions (if any) were answered. Julaine Hua, [place] 10/19/2019 9:14 AM

## 2019-10-19 NOTE — Telephone Encounter (Signed)
I dont think it is cardiac - needs to see PCP back and get testosterone and Vit D levels done

## 2019-10-19 NOTE — Telephone Encounter (Signed)
Spoke with patient and made him aware of Dr. Theodosia Blender recommendation to see his PCP for a check of his testosterone and Vit D. Patient verbalized understanding.

## 2019-12-06 DIAGNOSIS — E782 Mixed hyperlipidemia: Secondary | ICD-10-CM | POA: Diagnosis not present

## 2019-12-06 DIAGNOSIS — M1711 Unilateral primary osteoarthritis, right knee: Secondary | ICD-10-CM | POA: Diagnosis not present

## 2019-12-06 DIAGNOSIS — I10 Essential (primary) hypertension: Secondary | ICD-10-CM | POA: Diagnosis not present

## 2019-12-06 DIAGNOSIS — R109 Unspecified abdominal pain: Secondary | ICD-10-CM | POA: Diagnosis not present

## 2019-12-06 DIAGNOSIS — R5383 Other fatigue: Secondary | ICD-10-CM | POA: Diagnosis not present

## 2019-12-06 DIAGNOSIS — E1169 Type 2 diabetes mellitus with other specified complication: Secondary | ICD-10-CM | POA: Diagnosis not present

## 2019-12-07 DIAGNOSIS — E1169 Type 2 diabetes mellitus with other specified complication: Secondary | ICD-10-CM | POA: Diagnosis not present

## 2019-12-08 ENCOUNTER — Other Ambulatory Visit: Payer: Self-pay

## 2019-12-08 ENCOUNTER — Inpatient Hospital Stay (HOSPITAL_COMMUNITY): Payer: PPO

## 2019-12-08 ENCOUNTER — Inpatient Hospital Stay (HOSPITAL_COMMUNITY)
Admission: EM | Admit: 2019-12-08 | Discharge: 2019-12-30 | DRG: 215 | Disposition: E | Payer: PPO | Attending: Cardiology | Admitting: Cardiology

## 2019-12-08 ENCOUNTER — Emergency Department (HOSPITAL_COMMUNITY): Payer: PPO

## 2019-12-08 ENCOUNTER — Encounter (HOSPITAL_COMMUNITY): Payer: Self-pay

## 2019-12-08 ENCOUNTER — Encounter (HOSPITAL_COMMUNITY): Admission: EM | Disposition: E | Payer: Self-pay | Source: Home / Self Care | Attending: Cardiology

## 2019-12-08 DIAGNOSIS — R079 Chest pain, unspecified: Secondary | ICD-10-CM

## 2019-12-08 DIAGNOSIS — I959 Hypotension, unspecified: Secondary | ICD-10-CM | POA: Diagnosis not present

## 2019-12-08 DIAGNOSIS — R34 Anuria and oliguria: Secondary | ICD-10-CM | POA: Diagnosis present

## 2019-12-08 DIAGNOSIS — E1121 Type 2 diabetes mellitus with diabetic nephropathy: Secondary | ICD-10-CM | POA: Diagnosis not present

## 2019-12-08 DIAGNOSIS — I2510 Atherosclerotic heart disease of native coronary artery without angina pectoris: Secondary | ICD-10-CM | POA: Diagnosis present

## 2019-12-08 DIAGNOSIS — R579 Shock, unspecified: Secondary | ICD-10-CM

## 2019-12-08 DIAGNOSIS — I7100 Dissection of unspecified site of aorta: Secondary | ICD-10-CM | POA: Diagnosis not present

## 2019-12-08 DIAGNOSIS — Z885 Allergy status to narcotic agent status: Secondary | ICD-10-CM

## 2019-12-08 DIAGNOSIS — Z882 Allergy status to sulfonamides status: Secondary | ICD-10-CM

## 2019-12-08 DIAGNOSIS — Z888 Allergy status to other drugs, medicaments and biological substances status: Secondary | ICD-10-CM

## 2019-12-08 DIAGNOSIS — Z4659 Encounter for fitting and adjustment of other gastrointestinal appliance and device: Secondary | ICD-10-CM

## 2019-12-08 DIAGNOSIS — E1122 Type 2 diabetes mellitus with diabetic chronic kidney disease: Secondary | ICD-10-CM | POA: Diagnosis present

## 2019-12-08 DIAGNOSIS — I213 ST elevation (STEMI) myocardial infarction of unspecified site: Secondary | ICD-10-CM

## 2019-12-08 DIAGNOSIS — Z8249 Family history of ischemic heart disease and other diseases of the circulatory system: Secondary | ICD-10-CM | POA: Diagnosis not present

## 2019-12-08 DIAGNOSIS — N183 Chronic kidney disease, stage 3 unspecified: Secondary | ICD-10-CM | POA: Diagnosis present

## 2019-12-08 DIAGNOSIS — I2119 ST elevation (STEMI) myocardial infarction involving other coronary artery of inferior wall: Secondary | ICD-10-CM | POA: Diagnosis not present

## 2019-12-08 DIAGNOSIS — E875 Hyperkalemia: Secondary | ICD-10-CM | POA: Diagnosis not present

## 2019-12-08 DIAGNOSIS — Z87891 Personal history of nicotine dependence: Secondary | ICD-10-CM

## 2019-12-08 DIAGNOSIS — E785 Hyperlipidemia, unspecified: Secondary | ICD-10-CM | POA: Diagnosis present

## 2019-12-08 DIAGNOSIS — I2111 ST elevation (STEMI) myocardial infarction involving right coronary artery: Secondary | ICD-10-CM | POA: Diagnosis not present

## 2019-12-08 DIAGNOSIS — N171 Acute kidney failure with acute cortical necrosis: Secondary | ICD-10-CM

## 2019-12-08 DIAGNOSIS — I442 Atrioventricular block, complete: Secondary | ICD-10-CM | POA: Diagnosis not present

## 2019-12-08 DIAGNOSIS — Z79899 Other long term (current) drug therapy: Secondary | ICD-10-CM

## 2019-12-08 DIAGNOSIS — I361 Nonrheumatic tricuspid (valve) insufficiency: Secondary | ICD-10-CM | POA: Diagnosis not present

## 2019-12-08 DIAGNOSIS — Z20828 Contact with and (suspected) exposure to other viral communicable diseases: Secondary | ICD-10-CM | POA: Diagnosis present

## 2019-12-08 DIAGNOSIS — J9811 Atelectasis: Secondary | ICD-10-CM | POA: Diagnosis not present

## 2019-12-08 DIAGNOSIS — R570 Cardiogenic shock: Secondary | ICD-10-CM | POA: Diagnosis not present

## 2019-12-08 DIAGNOSIS — I129 Hypertensive chronic kidney disease with stage 1 through stage 4 chronic kidney disease, or unspecified chronic kidney disease: Secondary | ICD-10-CM | POA: Diagnosis not present

## 2019-12-08 DIAGNOSIS — E871 Hypo-osmolality and hyponatremia: Secondary | ICD-10-CM | POA: Diagnosis present

## 2019-12-08 DIAGNOSIS — I462 Cardiac arrest due to underlying cardiac condition: Secondary | ICD-10-CM | POA: Diagnosis not present

## 2019-12-08 DIAGNOSIS — Z66 Do not resuscitate: Secondary | ICD-10-CM | POA: Diagnosis present

## 2019-12-08 DIAGNOSIS — J9601 Acute respiratory failure with hypoxia: Secondary | ICD-10-CM | POA: Diagnosis not present

## 2019-12-08 DIAGNOSIS — E872 Acidosis: Secondary | ICD-10-CM | POA: Diagnosis present

## 2019-12-08 DIAGNOSIS — Z794 Long term (current) use of insulin: Secondary | ICD-10-CM | POA: Diagnosis not present

## 2019-12-08 DIAGNOSIS — Z7289 Other problems related to lifestyle: Secondary | ICD-10-CM

## 2019-12-08 DIAGNOSIS — N179 Acute kidney failure, unspecified: Secondary | ICD-10-CM | POA: Diagnosis not present

## 2019-12-08 DIAGNOSIS — G4733 Obstructive sleep apnea (adult) (pediatric): Secondary | ICD-10-CM | POA: Diagnosis not present

## 2019-12-08 DIAGNOSIS — Z6841 Body Mass Index (BMI) 40.0 and over, adult: Secondary | ICD-10-CM

## 2019-12-08 DIAGNOSIS — R0602 Shortness of breath: Secondary | ICD-10-CM | POA: Diagnosis not present

## 2019-12-08 DIAGNOSIS — Z79891 Long term (current) use of opiate analgesic: Secondary | ICD-10-CM

## 2019-12-08 HISTORY — PX: CORONARY/GRAFT ACUTE MI REVASCULARIZATION: CATH118305

## 2019-12-08 LAB — PROTIME-INR
INR: 1.1 (ref 0.8–1.2)
Prothrombin Time: 14.5 seconds (ref 11.4–15.2)

## 2019-12-08 LAB — ECHOCARDIOGRAM LIMITED
Height: 71 in
Weight: 4848 oz

## 2019-12-08 LAB — BASIC METABOLIC PANEL
Anion gap: 17 — ABNORMAL HIGH (ref 5–15)
BUN: 62 mg/dL — ABNORMAL HIGH (ref 8–23)
CO2: 19 mmol/L — ABNORMAL LOW (ref 22–32)
Calcium: 8.7 mg/dL — ABNORMAL LOW (ref 8.9–10.3)
Chloride: 92 mmol/L — ABNORMAL LOW (ref 98–111)
Creatinine, Ser: 4.18 mg/dL — ABNORMAL HIGH (ref 0.61–1.24)
GFR calc Af Amer: 15 mL/min — ABNORMAL LOW (ref >60)
GFR calc non Af Amer: 13 mL/min — ABNORMAL LOW (ref >60)
Glucose, Bld: 204 mg/dL — ABNORMAL HIGH (ref 70–99)
Potassium: 4.9 mmol/L (ref 3.5–5.1)
Sodium: 128 mmol/L — ABNORMAL LOW (ref 135–145)

## 2019-12-08 LAB — POCT I-STAT, CHEM 8
BUN: 62 mg/dL — ABNORMAL HIGH (ref 8–23)
Calcium, Ion: 1.11 mmol/L — ABNORMAL LOW (ref 1.15–1.40)
Chloride: 97 mmol/L — ABNORMAL LOW (ref 98–111)
Creatinine, Ser: 4.2 mg/dL — ABNORMAL HIGH (ref 0.61–1.24)
Glucose, Bld: 223 mg/dL — ABNORMAL HIGH (ref 70–99)
HCT: 34 % — ABNORMAL LOW (ref 39.0–52.0)
Hemoglobin: 11.6 g/dL — ABNORMAL LOW (ref 13.0–17.0)
Potassium: 5.4 mmol/L — ABNORMAL HIGH (ref 3.5–5.1)
Sodium: 127 mmol/L — ABNORMAL LOW (ref 135–145)
TCO2: 19 mmol/L — ABNORMAL LOW (ref 22–32)

## 2019-12-08 LAB — POCT ACTIVATED CLOTTING TIME
Activated Clotting Time: 158 seconds
Activated Clotting Time: 213 seconds
Activated Clotting Time: 367 seconds

## 2019-12-08 LAB — CBC
HCT: 38.6 % — ABNORMAL LOW (ref 39.0–52.0)
Hemoglobin: 12.7 g/dL — ABNORMAL LOW (ref 13.0–17.0)
MCH: 30.2 pg (ref 26.0–34.0)
MCHC: 32.9 g/dL (ref 30.0–36.0)
MCV: 91.7 fL (ref 80.0–100.0)
Platelets: 177 10*3/uL (ref 150–400)
RBC: 4.21 MIL/uL — ABNORMAL LOW (ref 4.22–5.81)
RDW: 14.4 % (ref 11.5–15.5)
WBC: 14.4 10*3/uL — ABNORMAL HIGH (ref 4.0–10.5)
nRBC: 0 % (ref 0.0–0.2)

## 2019-12-08 LAB — TROPONIN I (HIGH SENSITIVITY)
Troponin I (High Sensitivity): >27000 ng/L (ref <18)
Troponin I (High Sensitivity): >27000 ng/L (ref <18)

## 2019-12-08 LAB — LIPID PANEL
Cholesterol: 132 mg/dL (ref 0–200)
HDL: 32 mg/dL — ABNORMAL LOW (ref >40)
LDL Cholesterol: 74 mg/dL (ref 0–99)
Total CHOL/HDL Ratio: 4.1 RATIO
Triglycerides: 129 mg/dL (ref <150)
VLDL: 26 mg/dL (ref 0–40)

## 2019-12-08 LAB — RESPIRATORY PANEL BY RT PCR (FLU A&B, COVID)
Influenza A by PCR: NEGATIVE
Influenza B by PCR: NEGATIVE
SARS Coronavirus 2 by RT PCR: NEGATIVE

## 2019-12-08 LAB — HEMOGLOBIN A1C
Hgb A1c MFr Bld: 8.4 % — ABNORMAL HIGH (ref 4.8–5.6)
Mean Plasma Glucose: 194.38 mg/dL

## 2019-12-08 LAB — APTT: aPTT: 31 seconds (ref 24–36)

## 2019-12-08 LAB — MRSA PCR SCREENING: MRSA by PCR: NEGATIVE

## 2019-12-08 SURGERY — CORONARY/GRAFT ACUTE MI REVASCULARIZATION
Anesthesia: LOCAL

## 2019-12-08 MED ORDER — LIDOCAINE HCL (PF) 1 % IJ SOLN
INTRAMUSCULAR | Status: AC
  Filled 2019-12-08: qty 30

## 2019-12-08 MED ORDER — HEPARIN SODIUM (PORCINE) 5000 UNIT/ML IJ SOLN
5000.0000 [IU] | Freq: Three times a day (TID) | INTRAMUSCULAR | Status: DC
  Administered 2019-12-09: 5000 [IU] via SUBCUTANEOUS
  Filled 2019-12-08: qty 1

## 2019-12-08 MED ORDER — HYDROMORPHONE HCL 1 MG/ML IJ SOLN
0.5000 mg | INTRAMUSCULAR | Status: DC | PRN
  Administered 2019-12-08: 1 mg via INTRAVENOUS
  Filled 2019-12-08: qty 1

## 2019-12-08 MED ORDER — HEPARIN SODIUM (PORCINE) 1000 UNIT/ML IJ SOLN
INTRAMUSCULAR | Status: AC
  Filled 2019-12-08: qty 1

## 2019-12-08 MED ORDER — NOREPINEPHRINE 4 MG/250ML-% IV SOLN
INTRAVENOUS | Status: AC
  Filled 2019-12-08: qty 250

## 2019-12-08 MED ORDER — CLOPIDOGREL BISULFATE 75 MG PO TABS: 75.0000 mg | ORAL_TABLET | Freq: Every day | ORAL | Status: DC

## 2019-12-08 MED ORDER — INSULIN ASPART 100 UNIT/ML ~~LOC~~ SOLN: 0.0000 [IU] | Freq: Three times a day (TID) | SUBCUTANEOUS | Status: DC

## 2019-12-08 MED ORDER — NITROGLYCERIN 1 MG/10 ML FOR IR/CATH LAB
INTRA_ARTERIAL | Status: DC | PRN
  Administered 2019-12-08: 200 ug via INTRACORONARY

## 2019-12-08 MED ORDER — TIROFIBAN (AGGRASTAT) BOLUS VIA INFUSION
25.0000 ug/kg | Freq: Once | INTRAVENOUS | Status: DC
  Filled 2019-12-08: qty 69

## 2019-12-08 MED ORDER — ONDANSETRON HCL 4 MG/2ML IJ SOLN
4.0000 mg | Freq: Four times a day (QID) | INTRAMUSCULAR | Status: DC | PRN
  Administered 2019-12-08 – 2019-12-09 (×2): 4 mg via INTRAVENOUS
  Filled 2019-12-08 (×2): qty 2

## 2019-12-08 MED ORDER — IOHEXOL 350 MG/ML SOLN
INTRAVENOUS | Status: DC | PRN
  Administered 2019-12-08: 117 mL via INTRA_ARTERIAL

## 2019-12-08 MED ORDER — VERAPAMIL HCL 2.5 MG/ML IV SOLN
INTRAVENOUS | Status: DC | PRN
  Administered 2019-12-08: 10 mL via INTRA_ARTERIAL

## 2019-12-08 MED ORDER — NOREPINEPHRINE BITARTRATE 1 MG/ML IV SOLN
INTRAVENOUS | Status: AC | PRN
  Administered 2019-12-08: 15 ug/min via INTRAVENOUS

## 2019-12-08 MED ORDER — SODIUM CHLORIDE 0.9 % IV SOLN
INTRAVENOUS | Status: AC
  Administered 2019-12-08: 22:00:00 via INTRAVENOUS

## 2019-12-08 MED ORDER — NOREPINEPHRINE 4 MG/250ML-% IV SOLN
INTRAVENOUS | Status: AC
  Administered 2019-12-08: 4 mg
  Filled 2019-12-08: qty 250

## 2019-12-08 MED ORDER — HEPARIN SODIUM (PORCINE) 5000 UNIT/ML IJ SOLN
4000.0000 [IU] | Freq: Once | INTRAMUSCULAR | Status: AC
  Administered 2019-12-08: 4000 [IU] via INTRAVENOUS
  Filled 2019-12-08: qty 0.8

## 2019-12-08 MED ORDER — ACETAMINOPHEN 325 MG PO TABS: 650.0000 mg | ORAL_TABLET | ORAL | Status: DC | PRN

## 2019-12-08 MED ORDER — LIDOCAINE HCL (PF) 1 % IJ SOLN
INTRAMUSCULAR | Status: DC | PRN
  Administered 2019-12-08: 5 mL via SUBCUTANEOUS

## 2019-12-08 MED ORDER — HEPARIN (PORCINE) IN NACL 1000-0.9 UT/500ML-% IV SOLN
INTRAVENOUS | Status: AC
  Filled 2019-12-08: qty 500

## 2019-12-08 MED ORDER — METOPROLOL TARTRATE 5 MG/5ML IV SOLN
INTRAVENOUS | Status: AC
  Filled 2019-12-08: qty 5

## 2019-12-08 MED ORDER — TIROFIBAN HCL IN NACL 5-0.9 MG/100ML-% IV SOLN
INTRAVENOUS | Status: DC | PRN
  Administered 2019-12-08: 0.075 ug/kg/min via INTRAVENOUS

## 2019-12-08 MED ORDER — NITROGLYCERIN 1 MG/10 ML FOR IR/CATH LAB
INTRA_ARTERIAL | Status: AC
  Filled 2019-12-08: qty 10

## 2019-12-08 MED ORDER — CLOPIDOGREL BISULFATE 300 MG PO TABS
300.0000 mg | ORAL_TABLET | Freq: Once | ORAL | Status: AC
  Administered 2019-12-09: 300 mg via ORAL
  Filled 2019-12-08: qty 1

## 2019-12-08 MED ORDER — SODIUM CHLORIDE 0.9% FLUSH
3.0000 mL | Freq: Two times a day (BID) | INTRAVENOUS | Status: DC
  Administered 2019-12-08 – 2019-12-10 (×2): 3 mL via INTRAVENOUS

## 2019-12-08 MED ORDER — SODIUM CHLORIDE 0.9 % IV SOLN: INTRAVENOUS | Status: DC

## 2019-12-08 MED ORDER — TIROFIBAN (AGGRASTAT) BOLUS VIA INFUSION
INTRAVENOUS | Status: DC | PRN
  Administered 2019-12-08: 3435 ug via INTRAVENOUS

## 2019-12-08 MED ORDER — SODIUM CHLORIDE 0.9% FLUSH: 3.0000 mL | INTRAVENOUS | Status: DC | PRN

## 2019-12-08 MED ORDER — TIROFIBAN HCL IV 12.5 MG/250 ML: 0.0750 ug/kg/min | INTRAVENOUS | Status: DC

## 2019-12-08 MED ORDER — TIROFIBAN HCL IN NACL 5-0.9 MG/100ML-% IV SOLN
INTRAVENOUS | Status: AC
  Filled 2019-12-08: qty 200

## 2019-12-08 MED ORDER — MORPHINE SULFATE (PF) 2 MG/ML IV SOLN
INTRAVENOUS | Status: AC
  Filled 2019-12-08: qty 1

## 2019-12-08 MED ORDER — ASPIRIN 81 MG PO CHEW
324.0000 mg | CHEWABLE_TABLET | Freq: Once | ORAL | Status: AC
  Administered 2019-12-08: 324 mg via ORAL
  Filled 2019-12-08: qty 4

## 2019-12-08 MED ORDER — NOREPINEPHRINE 4 MG/250ML-% IV SOLN
0.0000 ug/min | INTRAVENOUS | Status: DC
  Administered 2019-12-08: 20 ug/min via INTRAVENOUS
  Filled 2019-12-08 (×2): qty 250

## 2019-12-08 MED ORDER — VERAPAMIL HCL 2.5 MG/ML IV SOLN
INTRAVENOUS | Status: AC
  Filled 2019-12-08: qty 2

## 2019-12-08 MED ORDER — SODIUM CHLORIDE 0.9 % IV SOLN: 250.0000 mL | INTRAVENOUS | Status: DC | PRN

## 2019-12-08 MED ORDER — HYDRALAZINE HCL 20 MG/ML IJ SOLN: 10.0000 mg | INTRAMUSCULAR | Status: DC | PRN

## 2019-12-08 MED ORDER — LABETALOL HCL 5 MG/ML IV SOLN: 10.0000 mg | INTRAVENOUS | Status: DC | PRN

## 2019-12-08 MED ORDER — FENTANYL CITRATE (PF) 100 MCG/2ML IJ SOLN
INTRAMUSCULAR | Status: AC
  Filled 2019-12-08: qty 2

## 2019-12-08 MED ORDER — FENTANYL CITRATE (PF) 100 MCG/2ML IJ SOLN
INTRAMUSCULAR | Status: DC | PRN
  Administered 2019-12-08 (×2): 50 ug via INTRAVENOUS

## 2019-12-08 MED ORDER — HEPARIN (PORCINE) IN NACL 1000-0.9 UT/500ML-% IV SOLN
INTRAVENOUS | Status: DC | PRN
  Administered 2019-12-08 (×2): 500 mL

## 2019-12-08 MED ORDER — HEPARIN SODIUM (PORCINE) 1000 UNIT/ML IJ SOLN
INTRAMUSCULAR | Status: DC | PRN
  Administered 2019-12-08: 6000 [IU] via INTRAVENOUS
  Administered 2019-12-08: 5000 [IU] via INTRAVENOUS

## 2019-12-08 MED ORDER — OXYCODONE HCL ER 15 MG PO T12A
15.0000 mg | EXTENDED_RELEASE_TABLET | Freq: Two times a day (BID) | ORAL | Status: DC
  Administered 2019-12-08: 15 mg via ORAL
  Filled 2019-12-08: qty 1

## 2019-12-08 MED ORDER — SODIUM CHLORIDE 0.9 % IV SOLN
INTRAVENOUS | Status: DC
  Administered 2019-12-08: 17:00:00 via INTRAVENOUS

## 2019-12-08 MED ORDER — FUROSEMIDE 10 MG/ML IJ SOLN
80.0000 mg | Freq: Once | INTRAMUSCULAR | Status: AC
  Administered 2019-12-08: 80 mg via INTRAVENOUS
  Filled 2019-12-08: qty 8

## 2019-12-08 MED ORDER — ASPIRIN 81 MG PO CHEW
81.0000 mg | CHEWABLE_TABLET | Freq: Every day | ORAL | Status: DC
  Administered 2019-12-08: 81 mg via ORAL
  Filled 2019-12-08: qty 1

## 2019-12-08 SURGICAL SUPPLY — 20 items
BALLN SAPPHIRE 2.5X12 (BALLOONS) ×2
BALLOON SAPPHIRE 2.5X12 (BALLOONS) IMPLANT
CATH EXTRAC PRONTO 5.5F 138CM (CATHETERS) ×1 IMPLANT
CATH LAUNCHER 6FR AL.75 (CATHETERS) ×1 IMPLANT
CATH LAUNCHER 6FR JR4 (CATHETERS) ×1 IMPLANT
CATH OPTITORQUE TIG 4.0 5F (CATHETERS) ×1 IMPLANT
DEVICE RAD COMP TR BAND LRG (VASCULAR PRODUCTS) ×1 IMPLANT
GLIDESHEATH SLEND SS 6F .021 (SHEATH) ×1 IMPLANT
GUIDEWIRE INQWIRE 1.5J.035X260 (WIRE) IMPLANT
INQWIRE 1.5J .035X260CM (WIRE) ×2
KIT ENCORE 26 ADVANTAGE (KITS) ×1 IMPLANT
KIT HEART LEFT (KITS) ×2 IMPLANT
PACK CARDIAC CATHETERIZATION (CUSTOM PROCEDURE TRAY) ×2 IMPLANT
SHEATH PROBE COVER 6X72 (BAG) ×1 IMPLANT
TRANSDUCER W/STOPCOCK (MISCELLANEOUS) ×2 IMPLANT
TUBING CIL FLEX 10 FLL-RA (TUBING) ×2 IMPLANT
WIRE ASAHI FIELDER XT 190CM (WIRE) ×1 IMPLANT
WIRE ASAHI PROWATER 180CM (WIRE) ×1 IMPLANT
WIRE PT2 MS 185 (WIRE) ×1 IMPLANT
WIRE RUNTHROUGH .014X180CM (WIRE) ×1 IMPLANT

## 2019-12-08 NOTE — Progress Notes (Signed)
  Echocardiogram 2D Echocardiogram has been performed.  Clarence Estrada 12/20/2019, 7:40 PM

## 2019-12-08 NOTE — Consult Note (Addendum)
[facility]  HISTORY AND PHYSICAL  Clarence Estrada is an 73 y.o. male.    Chief Complaint: chest pain  HPI: Pt is a 89M with a PMH sig for HTN, DM II, dilated aortic root, obesity, OSA, and gout who is now seen in consultation at the request of Dr. Herbie Baltimore for evaluation and recommendations surrounding presumed acute on chronic CKD.    Pt has a h/o CAD.  Had chest pain/ abd pain SOB since Sunday.  Hasn't been able to eat and drink.  Symptoms didn't remit so he came to the [facility] ED and was found to have an acute inferior STEMI.  Was transferred to [facility] for cardiac cath.  He had a quick jerking movement today in cath lab secondary to back spasm, causing catheter to dive deep, resulting in quarter sized localized dissection of the aortic root.  RCA totally occluded, RV essentially Akinetic.  CVTS consulted.  Limited echo showed mild dilatation of aortic root of 40 mm.  Labs reveal Na of 128, Na 4.9, CL 92, CO2 19, BUN 62, Cr 4.18.  High sens trop > 27,000.  WBC ct 14.4, Hgb 12.7, Hct 38.6, Plts 177.  Last known baseline creatinine was 1.06 in 08/2016.  In this setting we are asked to see.    Pt reports some nausea.  Is on norepi @ 20.  Pressures are a little soft, 100/50.  On NS @ 125 mL/ hr, had received IV Lasix as well.  Pt denies NSAID usage.  100 mL contrast was used Was taking all meds until 2 days ago.  Not on a diuretic at home per his report.    PMH: Past Medical History:  Diagnosis Date  . Benign essential HTN 09/04/2016  . Diabetes mellitus without complication (HCC)   . Dilated aortic root (HCC)    38mm by echo 2020  . Obesity (BMI 30-39.9) 03/26/2017  . OSA (obstructive sleep apnea) 03/26/2017   mild with AHI 7.4/hr and now on CPAP at 8cm H2O.    PSH: History reviewed. No pertinent surgical history.   Past Medical History:  Diagnosis Date  . Benign essential HTN 09/04/2016  . Diabetes mellitus without complication (HCC)   . Dilated aortic root (HCC)    38mm by echo  2020  . Obesity (BMI 30-39.9) 03/26/2017  . OSA (obstructive sleep apnea) 03/26/2017   mild with AHI 7.4/hr and now on CPAP at 8cm H2O.     Medications:   Scheduled: . [START ON 12/22/2019] clopidogrel  300 mg Oral Once  . furosemide  80 mg Intravenous Once  . [START ON 12/16/2019] insulin aspart  0-20 Units Subcutaneous TID WC  . oxyCODONE  15 mg Oral Q12H  . tirofiban  25 mcg/kg Intravenous Once    Medications Prior to Admission  Medication Sig Dispense Refill  . allopurinol (ZYLOPRIM) 300 MG tablet Take 300 mg by mouth daily.    . APPLE CIDER VINEGAR PO Take by mouth as directed.    . benazepril (LOTENSIN) 5 MG tablet Take 5 mg by mouth daily.    . Ginger, Zingiber officinalis, (GINGER ROOT) 500 MG CAPS Take by mouth as directed.    Marland Kitchen ibuprofen (ADVIL,MOTRIN) 800 MG tablet Take 1 tablet (800 mg total) by mouth 3 (three) times daily. 21 tablet 0  . insulin glargine (LANTUS) 100 UNIT/ML injection Inject 50 Units into the skin daily.    Marland Kitchen MAGNESIUM PO Take by mouth as directed.    Marland Kitchen NOVOLIN 70/30 (70-30)  100 UNIT/ML injection Inject into the skin 2 (two) times daily with a meal. 80 units morning and 70 at night    . oxyCODONE (OXYCONTIN) 15 mg 12 hr tablet Take 15 mg by mouth every 12 (twelve) hours.    . tamsulosin (FLOMAX) 0.4 MG CAPS Take 0.4 mg by mouth.    . TRAMADOL HCL PO Take by mouth as directed.    . TURMERIC PO Take by mouth as directed.      ALLERGIES:   Allergies  Allergen Reactions  . Hydrocortisone Rash  . Insulin Detemir   . Sulfa Antibiotics     "worst flu I've ever had"  . Cortisone Acetate [Cortisone] Rash    FAM HX: Family History  Problem Relation Age of Onset  . Polymyositis Father   . Heart disease Sister   . CAD Sister   . Hodgkin's lymphoma Sister     Social History:   reports that he quit smoking about 31 years ago. He has never used smokeless tobacco. He reports current alcohol use. He reports that he does not use drugs.  ROS: ROS: all  other systems reveiwed and are negative except as per HPI  Blood pressure (!) 80/61, pulse (!) 54, temperature 99.8 F (37.7 C), temperature source Oral, resp. rate (!) 25, height  (1.803 m), weight (!) 137.4 kg, SpO2 96 %. PHYSICAL EXAM: Physical Exam  GEN lying in bed, appears nauseated, cool cloth applied to forehead HEENT EOMI PERRL poor dentition NECK no JVD PULM normal WOB, clear bilaterally no c/w/r CV RRR no m/r/g ABD obese, NABS EXT 1+ LE edema NEURO AAO x 3 nonfocal SKIN normal turgor MSK no effusions   Results for orders placed or performed during the hospital encounter of 12/07/2019 (from the past 48 hour(s))  Respiratory Panel by RT PCR (Flu A&B, Covid) - Nasopharyngeal Swab     Status: None   Collection Time: 12/24/2019  4:29 PM   Specimen: Nasopharyngeal Swab  Result Value Ref Range   SARS Coronavirus 2 by RT PCR NEGATIVE NEGATIVE    Comment: (NOTE) SARS-CoV-2 target nucleic acids are NOT DETECTED. The SARS-CoV-2 RNA is generally detectable in upper respiratoy specimens during the acute phase of infection. The lowest concentration of SARS-CoV-2 viral copies this assay can detect is 131 copies/mL. A negative result does not preclude SARS-Cov-2 infection and should not be used as the sole basis for treatment or other patient management decisions. A negative result may occur with  improper specimen collection/handling, submission of specimen other than nasopharyngeal swab, presence of viral mutation(s) within the areas targeted by this assay, and inadequate number of viral copies (<131 copies/mL). A negative result must be combined with clinical observations, patient history, and epidemiological information. The expected result is Negative. Fact Sheet for Patients:  https://www.moore.com/ Fact Sheet for Healthcare Providers:  https://www.young.biz/ This test is not yet ap proved or cleared by the [place] FDA and  has  been authorized for detection and/or diagnosis of SARS-CoV-2 by FDA under an Emergency Use Authorization (EUA). This EUA will remain  in effect (meaning this test can be used) for the duration of the COVID-19 declaration under Section 564(b)(1) of the Act, 21 U.S.C. section 360bbb-3(b)(1), unless the authorization is terminated or revoked sooner.    Influenza A by PCR NEGATIVE NEGATIVE   Influenza B by PCR NEGATIVE NEGATIVE    Comment: (NOTE) The Xpert Xpress SARS-CoV-2/FLU/RSV assay is intended as an aid in  the diagnosis of influenza from Nasopharyngeal swab  specimens and  should not be used as a sole basis for treatment. Nasal washings and  aspirates are unacceptable for Xpert Xpress SARS-CoV-2/FLU/RSV  testing. Fact Sheet for Patients: https://www.moore.com/ Fact Sheet for Healthcare Providers: https://www.young.biz/ This test is not yet approved or cleared by the [place] FDA and  has been authorized for detection and/or diagnosis of SARS-CoV-2 by  FDA under an Emergency Use Authorization (EUA). This EUA will remain  in effect (meaning this test can be used) for the duration of the  Covid-19 declaration under Section 564(b)(1) of the Act, 21  U.S.C. section 360bbb-3(b)(1), unless the authorization is  terminated or revoked. Performed at [facility], [address] [place], [place] [address]   Basic metabolic panel     Status: Abnormal   Collection Time: 12-14-19  4:50 PM  Result Value Ref Range   Sodium 128 (L) 135 - 145 mmol/L   Potassium 4.9 3.5 - 5.1 mmol/L   Chloride 92 (L) 98 - 111 mmol/L   CO2 19 (L) 22 - 32 mmol/L   Glucose, Bld 204 (H) 70 - 99 mg/dL   BUN 62 (H) 8 - 23 mg/dL   Creatinine, Ser 2.03 (H) 0.61 - 1.24 mg/dL   Calcium 8.7 (L) 8.9 - 10.3 mg/dL   GFR calc non Af Amer 13 (L) >60 mL/min   GFR calc Af Amer 15 (L) >60 mL/min   Anion gap 17 (H) 5 - 15    Comment: Performed at [facility], [address] [place], [place] [address]  CBC     Status: Abnormal   Collection Time: 14-Dec-2019  4:50 PM  Result Value Ref Range   WBC 14.4 (H) 4.0 - 10.5 K/uL   RBC 4.21 (L) 4.22 - 5.81 MIL/uL   Hemoglobin 12.7 (L) 13.0 - 17.0 g/dL   HCT 16.3 (L) 84.5 - 36.4 %   MCV 91.7 80.0 - 100.0 fL   MCH 30.2 26.0 - 34.0 pg   MCHC 32.9 30.0 - 36.0 g/dL   RDW 68.0 32.1 - 22.4 %   Platelets 177 150 - 400 K/uL   nRBC 0.0 0.0 - 0.2 %    Comment: Performed at [facility], [address] [place], [place] [address]  Troponin I (High Sensitivity)     Status: Abnormal   Collection Time: Dec 14, 2019  4:50 PM  Result Value Ref Range   Troponin I (High Sensitivity) >27,000 (HH) <18 ng/L    Comment: RESULTS CONFIRMED BY MANUAL DILUTION CRITICAL RESULT CALLED TO, READ BACK BY AND VERIFIED WITH: N.DALTON AT 1840 ON 12-14-2019 BY N.THOMPSON (NOTE) Elevated high sensitivity troponin I (hsTnI) values and significant  changes across serial measurements may suggest ACS but many other  chronic and acute conditions are known to elevate hsTnI results.  Refer to the Links section for chest pain algorithms and additional  guidance. Performed at [facility], [address], [place], [place] [address]   Protime-INR     Status: None   Collection Time: 12-14-19  4:51 PM  Result Value Ref Range   Prothrombin Time 14.5 11.4 - 15.2 seconds   INR 1.1 0.8 - 1.2    Comment: (NOTE) INR goal varies based on device and disease states. Performed at [facility], [address] [place], [place] [address]   APTT     Status: None   Collection Time: Dec 14, 2019  4:51 PM  Result Value Ref Range   aPTT 31  24 - 36 seconds    Comment: Performed at [facility], [address] [place], [place] [address]  Lipid panel     Status: Abnormal   Collection Time: 06-19-19  4:52 PM  Result Value Ref Range   Cholesterol 132 0 - 200 mg/dL    Triglycerides 528129 <413<150 mg/dL   HDL 32 (L) >24>40 mg/dL   Total CHOL/HDL Ratio 4.1 RATIO   VLDL 26 0 - 40 mg/dL   LDL Cholesterol 74 0 - 99 mg/dL    Comment:        Total Cholesterol/HDL:CHD Risk Coronary Heart Disease Risk Table                     Men   Women  1/2 Average Risk   3.4   3.3  Average Risk       5.0   4.4  2 X Average Risk   9.6   7.1  3 X Average Risk  23.4   11.0        Use the calculated Patient Ratio above and the CHD Risk Table to determine the patient's CHD Risk.        ATP III CLASSIFICATION (LDL):  <100     mg/dL   Optimal  [id number]  mg/dL   Near or Above                    Optimal  130-159  mg/dL   Borderline  [id number]  mg/dL   High  >403>190     mg/dL   Very High Performed at [facility], [address] [place], [place] [address]   POCT Activated clotting time     Status: None   Collection Time: 06-19-19  5:27 PM  Result Value Ref Range   Activated Clotting Time 158 seconds  I-STAT, chem 8     Status: Abnormal   Collection Time: 06-19-19  5:27 PM  Result Value Ref Range   Sodium 127 (L) 135 - 145 mmol/L   Potassium 5.4 (H) 3.5 - 5.1 mmol/L   Chloride 97 (L) 98 - 111 mmol/L   BUN 62 (H) 8 - 23 mg/dL   Creatinine, Ser 9.564.20 (H) 0.61 - 1.24 mg/dL   Glucose, Bld 387223 (H) 70 - 99 mg/dL   Calcium, Ion 5.641.11 (L) 1.15 - 1.40 mmol/L   TCO2 19 (L) 22 - 32 mmol/L   Hemoglobin 11.6 (L) 13.0 - 17.0 g/dL   HCT 33.234.0 (L) 95.139.0 - 88.452.0 %  POCT Activated clotting time     Status: None   Collection Time: 06-19-19  5:36 PM  Result Value Ref Range   Activated Clotting Time 213 seconds  POCT Activated clotting time     Status: None   Collection Time: 06-19-19  6:00 PM  Result Value Ref Range   Activated Clotting Time 367 seconds    DG Chest Port 1 View  Result Date: 06-19-2019 CLINICAL DATA:  Chest pain for several days with shortness of breath EXAM: PORTABLE CHEST 1 VIEW COMPARISON:  09/05/2016 FINDINGS: Cardiac shadow is mildly enlarged but Century  to by the portable technique. The lungs are well aerated bilaterally. No focal infiltrate or sizable effusion is seen. No bony abnormality is noted. IMPRESSION: No active disease. Electronically Signed   By: Alcide CleverMark  Lukens M.D.   On: 06-19-2019 16:58    Assessment/Plan 1.  Acute on chronic CKD: last known Cr 1.06 2017, now up to 4.0  in the setting of poor PO intake, STEMI, and taking ACEi.  Agree with hydration for now, holding ACEi.  Will get renal US to ensure no obstruction.  Supportive care.  Have discussed with pt that he will need a cath and that carries risk of CIN.  Will follow closely for dialytic needs--> no indication for it now.  CRRT would be the most appropriate modality for this pt should the need arise.  Renal US to ensure no obstruction- Flomax on med list.    2.  Acute inferior STEMI: cath couldn't be completed, on heparin gtt, got ASA and is on Plavix, per cardiology.  Limited f/u echo showing 40 mm aortic root dilatation.  3.  Complete heart block: no BB, per cardiology  4.  Hypotension/ shock: cardiogenic in setting of akinetic RV, on norepi  5.  HLD: statin per primary  6.  Dispo in ICU  Nataniel Gasper, Benjamine Mola 12/28/2019, 7:00 PM

## 2019-12-08 NOTE — Progress Notes (Signed)
Reported Critical Troponin to Cards MD on call. While discussing Pt. MD requested A-line and Central line placement for continued IV Levo. MD aware of Cr and no new orders received. CCM paged.

## 2019-12-08 NOTE — H&P (Addendum)
Cardiology Admission History and Physical:   Patient ID: ISSAAC Estrada MRN: [id number]; DOB: [birth year]   Admission date: 2019-12-21  Primary Care Provider: Rebekah Chesterfield, NP Primary Cardiologist: No primary care provider on file.  Primary Electrophysiologist:  None   Chief Complaint:  Chest Pain  Patient Profile:   Clarence Estrada is a 73 y.o. 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  History of Present Illness:   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 Patient presented to [facility] ED with chest pain for 3 days.   Patient reports substernal non-radiating chest pain that apparently started on Sunday. Since then, patient has had continued chest pain and has felt more fatigued and short of breath. He reports some episodes of near syncope but denies any LOC. He reports his usual palpitations due to his PVCs/PACs. Unclear if he has had any true orthopnea or PND because he is on CPAP at night.  EKG in the ED showed ST elevation in inferior leads and some ST elevation in V5-V6 as well. Patient transferred to [facility] for emergent cardiac catheterization.   Upon arrival to cath lab, patient still having about 4/10 chest pain.  Of note, he has an occasional wet sounding cough. He also reports feeling fevers and having some chills today but denies any known exposure to COVID.  Heart Pathway Score:     Past Medical History:  Diagnosis Date  . Benign essential HTN 09/04/2016  . Diabetes mellitus without complication (HCC)   . Dilated aortic root (HCC)    40mm by echo 2020  . Obesity (BMI 30-39.9) 03/26/2017  . OSA (obstructive sleep apnea) 03/26/2017   mild with AHI 7.4/hr and now on CPAP  at 8cm H2O.     History reviewed. No pertinent surgical history.   Medications Prior to Admission: Prior to Admission medications   Medication Sig Start Date End Date Taking? Authorizing Provider  allopurinol (ZYLOPRIM) 300 MG tablet Take 300 mg by mouth daily.    [provider]  APPLE CIDER VINEGAR PO Take by mouth as directed.    [provider]  benazepril (LOTENSIN) 5 MG tablet Take 5 mg by mouth daily.    [provider]  Ginger, Zingiber officinalis, (GINGER ROOT) 500 MG CAPS Take by mouth as directed.    [provider]  ibuprofen (ADVIL,MOTRIN) 800 MG tablet Take 1 tablet (800 mg total) by mouth 3 (three) times daily. 07/09/13   Rancour, Jeannett Senior, MD  insulin glargine (LANTUS) 100 UNIT/ML injection Inject 50 Units into the skin daily.    [provider]  MAGNESIUM PO Take by mouth as directed.    [provider]  NOVOLIN 70/30 (70-30) 100 UNIT/ML injection Inject into the skin 2 (two) times daily with a meal. 80 units morning and 70 at night 08/04/19   [provider]  oxyCODONE (OXYCONTIN) 15 mg 12 hr tablet Take 15 mg by mouth every 12 (twelve) hours.    [provider]  tamsulosin (FLOMAX) 0.4 MG CAPS Take 0.4 mg by mouth.    [provider]  TRAMADOL HCL PO Take by mouth as directed.    [provider]  TURMERIC PO Take by  mouth as directed.    [provider]     Allergies:    Allergies  Allergen Reactions  . Hydrocortisone Rash  . Insulin Detemir   . Sulfa Antibiotics     "worst flu I've ever had"  . Cortisone Acetate [Cortisone] Rash    Social History:   Social History   Socioeconomic History  . Marital status: Married    Spouse name: Not on file  . Number of children: Not on file  . Years of education: Not on file  . Highest education level: Not on file  Occupational History  . Not on file  Tobacco Use  . Smoking status: Former Smoker    Quit date: 09/04/1988     Years since quitting: 31.2  . Smokeless tobacco: Never Used  Substance and Sexual Activity  . Alcohol use: Yes    Comment: social  . Drug use: No  . Sexual activity: Not on file  Other Topics Concern  . Not on file  Social History Narrative  . Not on file   Social Determinants of Health   Financial Resource Strain:   . Difficulty of Paying Living Expenses: Not on file  Food Insecurity:   . Worried About Programme researcher, broadcasting/film/videounning Out of Food in the Last Year: Not on file  . Ran Out of Food in the Last Year: Not on file  Transportation Needs:   . Lack of Transportation (Medical): Not on file  . Lack of Transportation (Non-Medical): Not on file  Physical Activity:   . Days of Exercise per Week: Not on file  . Minutes of Exercise per Session: Not on file  Stress:   . Feeling of Stress : Not on file  Social Connections:   . Frequency of Communication with Friends and Family: Not on file  . Frequency of Social Gatherings with Friends and Family: Not on file  . Attends Religious Services: Not on file  . Active Member of Clubs or Organizations: Not on file  . Attends BankerClub or Organization Meetings: Not on file  . Marital Status: Not on file  Intimate Partner Violence:   . Fear of Current or Ex-Partner: Not on file  . Emotionally Abused: Not on file  . Physically Abused: Not on file  . Sexually Abused: Not on file    Family History:   The patient's family history includes CAD in his sister; Heart disease in his sister; Hodgkin's lymphoma in his sister; Polymyositis in his father.    ROS:  Please see the history of present illness.  Unable to obtain full ROS due to need for emergent cardiac catheterization.  Physical Exam/Data:   Vitals:   12/26/2019 1611 12/18/2019 1635 12/20/2019 1645  BP: (!) 109/44 (!) 123/46 (!) 80/61  Pulse: (!) 55 (!) 54   Resp: (!) 22 (!) 34 (!) 25  Temp: 99.8 F (37.7 C)    TempSrc: Oral    SpO2: 95% 96%   Weight: (!) 137.4 kg    Height: 5\' 11"  (1.803 m)     No  intake or output data in the 24 hours ending 12/11/2019 1720 Last 3 Weights 12/21/2019 10/17/2019 10/03/2019  Weight (lbs) 303 lb 312 lb 312 lb 3.2 oz  Weight (kg) 137.44 kg 141.522 kg 141.613 kg     Body mass index is 42.26 kg/m.  General: 73 y.o. male resting comfortably in no acute distress. HEENT: Normocephalic and atraumatic. Sclera clear. EOMs intact. Neck: Supple.  Heart: RRR.  No obvious murmurs, gallops, or  rubs.  Lungs: No increased work of breathing. Faint expiratory wheezes noted anteriorly but otherwise lungs clear to auscultation.  Abdomen: Soft, non-distended, and non-tender to palpation. Extremities: No lower extremity edema.    Skin: Warm and dry. Neuro: Alert and oriented x3. No focal deficits. Psych: Normal affect. Responds appropriately.  EKG:  The ECG that was done was personally reviewed and demonstrates sinus bradycarida, rate 55 bpm, with inferior ST elevations.  Relevant CV Studies:  Echocardiogram 10/17/2019:  1. Aortic root measures 3.8 cm and Asc Ao measures 3.6 cm. These values are normal for age/sex. Indexing not recommended due to BSA 2.6 m2. No significant change from prior study (09/04/2016).  2. Left ventricular ejection fraction, by visual estimation, is 65 to 70%. The left ventricle has hyperdynamic function. Normal left ventricular size. There is no left ventricular hypertrophy.  3. Global right ventricle has normal systolic function.The right ventricular size is normal. No increase in right ventricular wall thickness.  4. Presence of pericardial fat pad.  5. The tricuspid valve is grossly normal. Tricuspid valve regurgitation is trivial.  6. The aortic valve is tricuspid. Mild to moderate aortic valve sclerosis/calcification without any evidence of aortic stenosis.  7. The pulmonic valve was grossly normal. Pulmonic valve regurgitation is not visualized by color flow Doppler.  8. Mild plaque invoving the ascending aorta.  9. Left ventricular diastolic  Doppler parameters are consistent with impaired relaxation pattern of LV diastolic filling. 10. Right atrial size was normal. 11. The mitral valve is grossly normal. No evidence of mitral valve regurgitation. No evidence of mitral stenosis. 12. Left atrial size was normal. 13. TR signal is inadequate for assessing pulmonary artery systolic pressure. 14. The inferior vena cava is normal in size with greater than 50% respiratory variability, suggesting right atrial pressure of 3 mmHg. 15. No significant change from prior (09/04/2016). _______________   Myoview 10/18/2019:  Nuclear stress EF: 60%.  There was no ST segment deviation noted during stress.  No T wave inversion was noted during stress.  The study is normal.  This is a low risk study.  The left ventricular ejection fraction is normal (55-65%).   Impression:  1.  There are reduced counts in the inferior wall on rest and stress imaging consistent with diaphragm attenuation.  The wall motion in this region is normal, further supporting diaphragm attenuation.  There is no evidence of reversible ischemia or prior infarction on this study. 2.  Normal left ventricular function. 3.  This is a low risk study.  Laboratory Data:  High Sensitivity Troponin:  No results for input(s): TROPONINIHS in the last 720 hours.    ChemistryNo results for input(s): NA, K, CL, CO2, GLUCOSE, BUN, CREATININE, CALCIUM, GFRNONAA, GFRAA, ANIONGAP in the last 168 hours.  No results for input(s): PROT, ALBUMIN, AST, ALT, ALKPHOS, BILITOT in the last 168 hours. Hematology Recent Labs  Lab 12/26/2019 1650  WBC 14.4*  RBC 4.21*  HGB 12.7*  HCT 38.6*  MCV 91.7  MCH 30.2  MCHC 32.9  RDW 14.4  PLT 177   BNPNo results for input(s): BNP, PROBNP in the last 168 hours.  DDimer No results for input(s): DDIMER in the last 168 hours.   Radiology/Studies:  DG Chest Port 1 View  Result Date: 12/13/2019 CLINICAL DATA:  Chest pain for several days with  shortness of breath EXAM: PORTABLE CHEST 1 VIEW COMPARISON:  09/05/2016 FINDINGS: Cardiac shadow is mildly enlarged but Century to by the portable technique. The lungs are well aerated  bilaterally. No focal infiltrate or sizable effusion is seen. No bony abnormality is noted. IMPRESSION: No active disease. Electronically Signed   By: Alcide Clever M.D.   On: 12/26/2019 16:58    Assessment and Plan:   Acute Inferior STEMI - Patient presented with chest pain x4 days and was found to have inferior STEMI. - Check Troponin. - Will check Echo, fasting lipid panel, and hemoglobin A1c. - Patient taken immediately to cath lab for emergent cardiac catheterization. Further recommendations to follow.   Complete Heart Block - Noted on monitor in cath lab. - Hopefully will resolve with revascularization. - Will need to monitor closely on telemetry.  Hypertension - History of hypertension but BP has been soft here. - On Benazepril  at home. Hold given AKI and soft BP.  - Continue to monitor.   AKI - Creatinine 4.18. BUN 62. - Plan is to use as little contrast as possible during cath.  - Will need to continue to monitor closely. Further recommendations following cath.  Diabetes Mellitus - Will check hemoglobin A1c. - On insulin at home.  - Will place on sliding scale here.  Hyponatremia - Sodium 128. - Suspect IV fluids after cath.   Of note, patient reports feeling feverish with chills today. Denies any COVID-19 testing. COVID pending.   Severity of Illness: The appropriate patient status for this patient is INPATIENT. Inpatient status is judged to be reasonable and necessary in order to provide the required intensity of service to ensure the patient's safety. The patient's presenting symptoms, physical exam findings, and initial radiographic and laboratory data in the context of their chronic comorbidities is felt to place them at high risk for further clinical deterioration.  Furthermore, it is not anticipated that the patient will be medically stable for discharge from the hospital within 2 midnights of admission. The following factors support the patient status of inpatient.   " The patient's presenting symptoms include chest pain. " The worrisome physical exam findings as above " The initial radiographic and laboratory data are worrisome because of ST elevations on EKG and elevated creatinine . " The chronic co-morbidities include HTN, DM, morbid obesity.   * I certify that at the point of admission it is my clinical judgment that the patient will require inpatient hospital care spanning beyond 2 midnights from the point of admission due to high intensity of service, high risk for further deterioration and high frequency of surveillance required.*    For questions or updates, please contact [facility] Please consult [email or web address] for contact info under        Signed, Corrin Parker, PA-C  12/18/2019 5:20 PM

## 2019-12-08 NOTE — ED Provider Notes (Addendum)
[facility] DEPT Provider Note   CSN: [id number] Arrival date & time: 12/22/2019  1603     History Chief Complaint  Patient presents with  . Chest Pain  . Abdominal Pain  . Shortness of Breath    Clarence Estrada is a 73 y.o. male.  72 year old male presents with chest pain that began several days ago.  Patient states the pain is minimal on his chest and nonradiating but associated with dyspnea on exertion.  Has had nausea no vomiting.  Some diaphoresis.  Denies any fever or cough.  Had a stress test recently which she said was okay.  No treatment use prior to arrival.        Past Medical History:  Diagnosis Date  . Benign essential HTN 09/04/2016  . Diabetes mellitus without complication ([place])   . Dilated aortic root ([place])    40mm by echo 2020  . Obesity (BMI 30-39.9) 03/26/2017  . OSA (obstructive sleep apnea) 03/26/2017   mild with AHI 7.4/hr and now on CPAP at 8cm H2O.     Patient Active Problem List   Diagnosis Date Noted  . OSA (obstructive sleep apnea) 03/26/2017  . Obesity (BMI 30-39.9) 03/26/2017  . Dilated aortic root ([place])   . Benign essential HTN 09/04/2016    History reviewed. No pertinent surgical history.     Family History  Problem Relation Age of Onset  . Polymyositis Father   . Heart disease Sister   . CAD Sister   . Hodgkin's lymphoma Sister     Social History   Tobacco Use  . Smoking status: Former Smoker    Quit date: 09/04/1988    Years since quitting: 31.2  . Smokeless tobacco: Never Used  Substance Use Topics  . Alcohol use: Yes    Comment: social  . Drug use: No    Home Medications Prior to Admission medications   Medication Sig Start Date End Date Taking? Authorizing Provider  allopurinol (ZYLOPRIM) 300 MG tablet Take 300 mg by mouth daily.    [provider]  APPLE CIDER VINEGAR PO Take by mouth as directed.    [provider]  benazepril (LOTENSIN) 5 MG tablet Take 5 mg by  mouth daily.    [provider]  Ginger, Zingiber officinalis, (GINGER ROOT) 500 MG CAPS Take by mouth as directed.    [provider]  ibuprofen (ADVIL,MOTRIN) 800 MG tablet Take 1 tablet (800 mg total) by mouth 3 (three) times daily. 07/09/13   Rancour, Annie Main, MD  insulin glargine (LANTUS) 100 UNIT/ML injection Inject 50 Units into the skin daily.    [provider]  MAGNESIUM PO Take by mouth as directed.    [provider]  NOVOLIN 70/30 (70-30) 100 UNIT/ML injection Inject into the skin 2 (two) times daily with a meal. 80 units morning and 70 at night 08/04/19   [provider]  oxyCODONE (OXYCONTIN) 15 mg 12 hr tablet Take 15 mg by mouth every 12 (twelve) hours.    [provider]  tamsulosin (FLOMAX) 0.4 MG CAPS Take 0.4 mg by mouth.    [provider]  TRAMADOL HCL PO Take by mouth as directed.    [provider]  TURMERIC PO Take by mouth as directed.    [provider]    Allergies    Hydrocortisone, Insulin detemir, Sulfa antibiotics, and Cortisone acetate [cortisone]  Review of Systems   Review of Systems  All other systems reviewed and are  negative.   Physical Exam Updated Vital Signs BP (!) 109/44 (BP Location: Right Arm)   Pulse (!) 55   Temp 99.8 F (37.7 C) (Oral)   Resp (!) 22   Ht 1.803 m (5\' 11" )   Wt (!) 137.4 kg   SpO2 95%   BMI 42.26 kg/m   Physical Exam Vitals and nursing note reviewed.  Constitutional:      General: He is not in acute distress.    Appearance: Normal appearance. He is well-developed. He is not toxic-appearing.  HENT:     Head: Normocephalic and atraumatic.  Eyes:     General: Lids are normal.     Conjunctiva/sclera: Conjunctivae normal.     Pupils: Pupils are equal, round, and reactive to light.  Neck:     Thyroid: No thyroid mass.     Trachea: No tracheal deviation.  Cardiovascular:     Rate and Rhythm: Normal rate and regular rhythm.     Heart  sounds: Normal heart sounds. No murmur. No gallop.   Pulmonary:     Effort: Pulmonary effort is normal. No respiratory distress.     Breath sounds: Normal breath sounds. No stridor. No decreased breath sounds, wheezing, rhonchi or rales.  Abdominal:     General: Bowel sounds are normal. There is no distension.     Palpations: Abdomen is soft.     Tenderness: There is no abdominal tenderness. There is no rebound.  Musculoskeletal:        General: No tenderness. Normal range of motion.     Cervical back: Normal range of motion and neck supple.  Skin:    General: Skin is warm and dry.     Findings: No abrasion or rash.  Neurological:     Mental Status: He is alert and oriented to person, place, and time.     GCS: GCS eye subscore is 4. GCS verbal subscore is 5. GCS motor subscore is 6.     Cranial Nerves: No cranial nerve deficit.     Sensory: No sensory deficit.  Psychiatric:        Speech: Speech normal.        Behavior: Behavior normal.     ED Results / Procedures / Treatments   Labs (all labs ordered are listed, but only abnormal results are displayed) Labs Reviewed  RESPIRATORY PANEL BY RT PCR (FLU A&B, COVID)  BASIC METABOLIC PANEL  CBC  PROTIME-INR  APTT  LIPID PANEL  TROPONIN I (HIGH SENSITIVITY)    EKG EKG Interpretation  Date/Time:  Thursday December 08 2019 16:13:23 EST Ventricular Rate:  55 PR Interval:    QRS Duration: 103 QT Interval:  462 QTC Calculation: 442 R Axis:   104 Text Interpretation: Sinus rhythm Short PR interval Right axis deviation Low voltage, precordial leads ** ** ACUTE MI / non-STEMI ** ** Confirmed by Lorre NickAllen, Omarian Jaquith ([id number]) on 06/27/19 4:30:14 PM   Radiology No results found.  Procedures Procedures (including critical care time)  Medications Ordered in ED Medications  0.9 %  sodium chloride infusion (has no administration in time range)  aspirin chewable tablet 324 mg (has no administration in time range)  heparin injection  4,000 Units (has no administration in time range)    ED Course  I have reviewed the triage vital signs and the nursing notes.  Pertinent labs & imaging results that were available during my care of the patient were reviewed by me and considered in my medical decision making (see chart for  details).    MDM Rules/Calculators/A&P     CHA2DS2/VAS Stroke Risk Points      N/A >= 2 Points: High Risk  1 - 1.99 Points: Medium Risk  0 Points: Low Risk    A final score could not be computed because of missing components.: Last  Change: N/A     This score determines the patient's risk of having a stroke if the  patient has atrial fibrillation.      This score is not applicable to this patient. Components are not  calculated.                   Patient given an aspirin as well as heparinized.  EKG consistent with acute MI.  Discussed with Dr. Susette Racer from cardiology.  He agrees and patient will be sent to [facility].  Patient states his pain is 5 of 10.  4:37 PM Patient had outpatient labs done 2 days ago which resulted today which does show evidence of acute kidney injury with creatinine 3.7.  Is elevated from his prior.  This was communicated to Dr. Herbie Baltimore the cardiologist who recommended patient receive IV fluids.  Patient is in no respiratory distress at this time.  CRITICAL CARE Performed by: Toy Baker Total critical care time: 45 minutes Critical care time was exclusive of separately billable procedures and treating other patients. Critical care was necessary to treat or prevent imminent or life-threatening deterioration. Critical care was time spent personally by me on the following activities: development of treatment plan with patient and/or surrogate as well as nursing, discussions with consultants, evaluation of patient's response to treatment, examination of patient, obtaining history from patient or surrogate, ordering and performing treatments and interventions, ordering and  review of laboratory studies, ordering and review of radiographic studies, pulse oximetry and re-evaluation of patient's condition.  Final Clinical Impression(s) / ED Diagnoses Final diagnoses:  None    Rx / DC Orders ED Discharge Orders    None       Lorre Nick, MD 12/16/2019 1631    Lorre Nick, MD 12/15/2019 [phone]

## 2019-12-08 NOTE — ED Triage Notes (Signed)
Patient ac/o mid chest pain x 3 days, Upper mid abdominal pain and SOB.

## 2019-12-09 ENCOUNTER — Inpatient Hospital Stay (HOSPITAL_COMMUNITY): Payer: PPO

## 2019-12-09 ENCOUNTER — Encounter (HOSPITAL_COMMUNITY): Admission: EM | Disposition: E | Payer: Self-pay | Source: Home / Self Care | Attending: Cardiology

## 2019-12-09 ENCOUNTER — Inpatient Hospital Stay (HOSPITAL_COMMUNITY): Payer: PPO | Admitting: Certified Registered"

## 2019-12-09 DIAGNOSIS — R570 Cardiogenic shock: Secondary | ICD-10-CM

## 2019-12-09 DIAGNOSIS — I361 Nonrheumatic tricuspid (valve) insufficiency: Secondary | ICD-10-CM

## 2019-12-09 DIAGNOSIS — I2119 ST elevation (STEMI) myocardial infarction involving other coronary artery of inferior wall: Principal | ICD-10-CM

## 2019-12-09 HISTORY — PX: VENTRICULAR ASSIST DEVICE INSERTION: CATH118273

## 2019-12-09 HISTORY — PX: TEMPORARY DIALYSIS CATHETER: CATH118312

## 2019-12-09 HISTORY — PX: RIGHT HEART CATH: CATH118263

## 2019-12-09 HISTORY — PX: TEMPORARY PACEMAKER: CATH118268

## 2019-12-09 LAB — POCT I-STAT 7, (LYTES, BLD GAS, ICA,H+H)
Acid-base deficit: 14 mmol/L — ABNORMAL HIGH (ref 0.0–2.0)
Acid-base deficit: 2 mmol/L (ref 0.0–2.0)
Acid-base deficit: 3 mmol/L — ABNORMAL HIGH (ref 0.0–2.0)
Acid-base deficit: 3 mmol/L — ABNORMAL HIGH (ref 0.0–2.0)
Acid-base deficit: 3 mmol/L — ABNORMAL HIGH (ref 0.0–2.0)
Acid-base deficit: 4 mmol/L — ABNORMAL HIGH (ref 0.0–2.0)
Acid-base deficit: 5 mmol/L — ABNORMAL HIGH (ref 0.0–2.0)
Acid-base deficit: 8 mmol/L — ABNORMAL HIGH (ref 0.0–2.0)
Acid-base deficit: 9 mmol/L — ABNORMAL HIGH (ref 0.0–2.0)
Bicarbonate: 12.9 mmol/L — ABNORMAL LOW (ref 20.0–28.0)
Bicarbonate: 16.1 mmol/L — ABNORMAL LOW (ref 20.0–28.0)
Bicarbonate: 20.4 mmol/L (ref 20.0–28.0)
Bicarbonate: 20.5 mmol/L (ref 20.0–28.0)
Bicarbonate: 22.8 mmol/L (ref 20.0–28.0)
Bicarbonate: 22.8 mmol/L (ref 20.0–28.0)
Bicarbonate: 23.4 mmol/L (ref 20.0–28.0)
Bicarbonate: 23.8 mmol/L (ref 20.0–28.0)
Bicarbonate: 24.6 mmol/L (ref 20.0–28.0)
Bicarbonate: 26.9 mmol/L (ref 20.0–28.0)
Calcium, Ion: 0.96 mmol/L — ABNORMAL LOW (ref 1.15–1.40)
Calcium, Ion: 1.04 mmol/L — ABNORMAL LOW (ref 1.15–1.40)
Calcium, Ion: 1.04 mmol/L — ABNORMAL LOW (ref 1.15–1.40)
Calcium, Ion: 1.06 mmol/L — ABNORMAL LOW (ref 1.15–1.40)
Calcium, Ion: 1.08 mmol/L — ABNORMAL LOW (ref 1.15–1.40)
Calcium, Ion: 1.09 mmol/L — ABNORMAL LOW (ref 1.15–1.40)
Calcium, Ion: 1.12 mmol/L — ABNORMAL LOW (ref 1.15–1.40)
Calcium, Ion: 1.13 mmol/L — ABNORMAL LOW (ref 1.15–1.40)
Calcium, Ion: 1.13 mmol/L — ABNORMAL LOW (ref 1.15–1.40)
Calcium, Ion: 1.24 mmol/L (ref 1.15–1.40)
HCT: 29 % — ABNORMAL LOW (ref 39.0–52.0)
HCT: 30 % — ABNORMAL LOW (ref 39.0–52.0)
HCT: 32 % — ABNORMAL LOW (ref 39.0–52.0)
HCT: 32 % — ABNORMAL LOW (ref 39.0–52.0)
HCT: 33 % — ABNORMAL LOW (ref 39.0–52.0)
HCT: 34 % — ABNORMAL LOW (ref 39.0–52.0)
HCT: 34 % — ABNORMAL LOW (ref 39.0–52.0)
HCT: 35 % — ABNORMAL LOW (ref 39.0–52.0)
HCT: 35 % — ABNORMAL LOW (ref 39.0–52.0)
HCT: 36 % — ABNORMAL LOW (ref 39.0–52.0)
Hemoglobin: 10.2 g/dL — ABNORMAL LOW (ref 13.0–17.0)
Hemoglobin: 10.9 g/dL — ABNORMAL LOW (ref 13.0–17.0)
Hemoglobin: 10.9 g/dL — ABNORMAL LOW (ref 13.0–17.0)
Hemoglobin: 11.2 g/dL — ABNORMAL LOW (ref 13.0–17.0)
Hemoglobin: 11.6 g/dL — ABNORMAL LOW (ref 13.0–17.0)
Hemoglobin: 11.6 g/dL — ABNORMAL LOW (ref 13.0–17.0)
Hemoglobin: 11.9 g/dL — ABNORMAL LOW (ref 13.0–17.0)
Hemoglobin: 11.9 g/dL — ABNORMAL LOW (ref 13.0–17.0)
Hemoglobin: 12.2 g/dL — ABNORMAL LOW (ref 13.0–17.0)
Hemoglobin: 9.9 g/dL — ABNORMAL LOW (ref 13.0–17.0)
O2 Saturation: 100 %
O2 Saturation: 100 %
O2 Saturation: 96 %
O2 Saturation: 97 %
O2 Saturation: 98 %
O2 Saturation: 98 %
O2 Saturation: 98 %
O2 Saturation: 98 %
O2 Saturation: 99 %
O2 Saturation: 99 %
Patient temperature: 37.3
Patient temperature: 37
Patient temperature: 34.3
Patient temperature: 36.9
Patient temperature: 36.7
Potassium: 3.4 mmol/L — ABNORMAL LOW (ref 3.5–5.1)
Potassium: 3.4 mmol/L — ABNORMAL LOW (ref 3.5–5.1)
Potassium: 3.6 mmol/L (ref 3.5–5.1)
Potassium: 3.6 mmol/L (ref 3.5–5.1)
Potassium: 3.9 mmol/L (ref 3.5–5.1)
Potassium: 4 mmol/L (ref 3.5–5.1)
Potassium: 4.2 mmol/L (ref 3.5–5.1)
Potassium: 4.6 mmol/L (ref 3.5–5.1)
Potassium: 4.6 mmol/L (ref 3.5–5.1)
Potassium: 6 mmol/L — ABNORMAL HIGH (ref 3.5–5.1)
Sodium: 126 mmol/L — ABNORMAL LOW (ref 135–145)
Sodium: 129 mmol/L — ABNORMAL LOW (ref 135–145)
Sodium: 132 mmol/L — ABNORMAL LOW (ref 135–145)
Sodium: 134 mmol/L — ABNORMAL LOW (ref 135–145)
Sodium: 136 mmol/L (ref 135–145)
Sodium: 137 mmol/L (ref 135–145)
Sodium: 138 mmol/L (ref 135–145)
Sodium: 138 mmol/L (ref 135–145)
Sodium: 139 mmol/L (ref 135–145)
Sodium: 141 mmol/L (ref 135–145)
TCO2: 14 mmol/L — ABNORMAL LOW (ref 22–32)
TCO2: 17 mmol/L — ABNORMAL LOW (ref 22–32)
TCO2: 22 mmol/L (ref 22–32)
TCO2: 22 mmol/L (ref 22–32)
TCO2: 24 mmol/L (ref 22–32)
TCO2: 24 mmol/L (ref 22–32)
TCO2: 25 mmol/L (ref 22–32)
TCO2: 25 mmol/L (ref 22–32)
TCO2: 26 mmol/L (ref 22–32)
TCO2: 28 mmol/L (ref 22–32)
pCO2 arterial: 31.3 mmHg — ABNORMAL LOW (ref 32.0–48.0)
pCO2 arterial: 33.5 mmHg (ref 32.0–48.0)
pCO2 arterial: 38.5 mmHg (ref 32.0–48.0)
pCO2 arterial: 42.5 mmHg (ref 32.0–48.0)
pCO2 arterial: 44.8 mmHg (ref 32.0–48.0)
pCO2 arterial: 45.6 mmHg (ref 32.0–48.0)
pCO2 arterial: 45.7 mmHg (ref 32.0–48.0)
pCO2 arterial: 49 mmHg — ABNORMAL HIGH (ref 32.0–48.0)
pCO2 arterial: 50.5 mmHg — ABNORMAL HIGH (ref 32.0–48.0)
pCO2 arterial: 51.9 mmHg — ABNORMAL HIGH (ref 32.0–48.0)
pH, Arterial: 7.193 — CL (ref 7.350–7.450)
pH, Arterial: 7.203 — ABNORMAL LOW (ref 7.350–7.450)
pH, Arterial: 7.307 — ABNORMAL LOW (ref 7.350–7.450)
pH, Arterial: 7.308 — ABNORMAL LOW (ref 7.350–7.450)
pH, Arterial: 7.316 — ABNORMAL LOW (ref 7.350–7.450)
pH, Arterial: 7.318 — ABNORMAL LOW (ref 7.350–7.450)
pH, Arterial: 7.319 — ABNORMAL LOW (ref 7.350–7.450)
pH, Arterial: 7.333 — ABNORMAL LOW (ref 7.350–7.450)
pH, Arterial: 7.335 — ABNORMAL LOW (ref 7.350–7.450)
pH, Arterial: 7.343 — ABNORMAL LOW (ref 7.350–7.450)
pO2, Arterial: 100 mmHg (ref 83.0–108.0)
pO2, Arterial: 108 mmHg (ref 83.0–108.0)
pO2, Arterial: 116 mmHg — ABNORMAL HIGH (ref 83.0–108.0)
pO2, Arterial: 117 mmHg — ABNORMAL HIGH (ref 83.0–108.0)
pO2, Arterial: 120 mmHg — ABNORMAL HIGH (ref 83.0–108.0)
pO2, Arterial: 132 mmHg — ABNORMAL HIGH (ref 83.0–108.0)
pO2, Arterial: 147 mmHg — ABNORMAL HIGH (ref 83.0–108.0)
pO2, Arterial: 437 mmHg — ABNORMAL HIGH (ref 83.0–108.0)
pO2, Arterial: 520 mmHg — ABNORMAL HIGH (ref 83.0–108.0)
pO2, Arterial: 88 mmHg (ref 83.0–108.0)

## 2019-12-09 LAB — COMPREHENSIVE METABOLIC PANEL
ALT: 298 U/L — ABNORMAL HIGH (ref 0–44)
ALT: 285 U/L — ABNORMAL HIGH (ref 0–44)
AST: 168 U/L — ABNORMAL HIGH (ref 15–41)
AST: 171 U/L — ABNORMAL HIGH (ref 15–41)
Albumin: 3 g/dL — ABNORMAL LOW (ref 3.5–5.0)
Albumin: 2.9 g/dL — ABNORMAL LOW (ref 3.5–5.0)
Alkaline Phosphatase: 53 U/L (ref 38–126)
Alkaline Phosphatase: 48 U/L (ref 38–126)
Anion gap: 14 (ref 5–15)
Anion gap: 16 — ABNORMAL HIGH (ref 5–15)
BUN: 68 mg/dL — ABNORMAL HIGH (ref 8–23)
BUN: 69 mg/dL — ABNORMAL HIGH (ref 8–23)
CO2: 15 mmol/L — ABNORMAL LOW (ref 22–32)
CO2: 21 mmol/L — ABNORMAL LOW (ref 22–32)
Calcium: 7.6 mg/dL — ABNORMAL LOW (ref 8.9–10.3)
Calcium: 8.1 mg/dL — ABNORMAL LOW (ref 8.9–10.3)
Chloride: 97 mmol/L — ABNORMAL LOW (ref 98–111)
Chloride: 100 mmol/L (ref 98–111)
Creatinine, Ser: 4.91 mg/dL — ABNORMAL HIGH (ref 0.61–1.24)
Creatinine, Ser: 4.36 mg/dL — ABNORMAL HIGH (ref 0.61–1.24)
GFR calc Af Amer: 13 mL/min — ABNORMAL LOW (ref >60)
GFR calc Af Amer: 15 mL/min — ABNORMAL LOW (ref >60)
GFR calc non Af Amer: 11 mL/min — ABNORMAL LOW (ref >60)
GFR calc non Af Amer: 13 mL/min — ABNORMAL LOW (ref >60)
Glucose, Bld: 281 mg/dL — ABNORMAL HIGH (ref 70–99)
Glucose, Bld: 249 mg/dL — ABNORMAL HIGH (ref 70–99)
Potassium: 6 mmol/L — ABNORMAL HIGH (ref 3.5–5.1)
Potassium: 3.7 mmol/L (ref 3.5–5.1)
Sodium: 126 mmol/L — ABNORMAL LOW (ref 135–145)
Sodium: 137 mmol/L (ref 135–145)
Total Bilirubin: 3.8 mg/dL — ABNORMAL HIGH (ref 0.3–1.2)
Total Bilirubin: 2 mg/dL — ABNORMAL HIGH (ref 0.3–1.2)
Total Protein: 6.4 g/dL — ABNORMAL LOW (ref 6.5–8.1)
Total Protein: 6 g/dL — ABNORMAL LOW (ref 6.5–8.1)

## 2019-12-09 LAB — LACTIC ACID, PLASMA
Lactic Acid, Venous: 2.4 mmol/L (ref 0.5–1.9)
Lactic Acid, Venous: 8.4 mmol/L (ref 0.5–1.9)
Lactic Acid, Venous: 4.5 mmol/L (ref 0.5–1.9)
Lactic Acid, Venous: 8.2 mmol/L (ref 0.5–1.9)
Lactic Acid, Venous: 4.6 mmol/L (ref 0.5–1.9)
Lactic Acid, Venous: 5.5 mmol/L (ref 0.5–1.9)
Lactic Acid, Venous: 4.6 mmol/L (ref 0.5–1.9)

## 2019-12-09 LAB — GLUCOSE, CAPILLARY
Glucose-Capillary: 407 mg/dL — ABNORMAL HIGH (ref 70–99)
Glucose-Capillary: 392 mg/dL — ABNORMAL HIGH (ref 70–99)
Glucose-Capillary: 324 mg/dL — ABNORMAL HIGH (ref 70–99)
Glucose-Capillary: 298 mg/dL — ABNORMAL HIGH (ref 70–99)
Glucose-Capillary: 291 mg/dL — ABNORMAL HIGH (ref 70–99)
Glucose-Capillary: 275 mg/dL — ABNORMAL HIGH (ref 70–99)
Glucose-Capillary: 210 mg/dL — ABNORMAL HIGH (ref 70–99)
Glucose-Capillary: 194 mg/dL — ABNORMAL HIGH (ref 70–99)
Glucose-Capillary: 188 mg/dL — ABNORMAL HIGH (ref 70–99)
Glucose-Capillary: 176 mg/dL — ABNORMAL HIGH (ref 70–99)
Glucose-Capillary: 145 mg/dL — ABNORMAL HIGH (ref 70–99)
Glucose-Capillary: 156 mg/dL — ABNORMAL HIGH (ref 70–99)
Glucose-Capillary: 137 mg/dL — ABNORMAL HIGH (ref 70–99)
Glucose-Capillary: 108 mg/dL — ABNORMAL HIGH (ref 70–99)

## 2019-12-09 LAB — POCT ACTIVATED CLOTTING TIME
Activated Clotting Time: 125 seconds
Activated Clotting Time: 136 seconds
Activated Clotting Time: 131 seconds
Activated Clotting Time: 142 seconds
Activated Clotting Time: 153 seconds
Activated Clotting Time: 246 seconds
Activated Clotting Time: 169 seconds
Activated Clotting Time: 147 seconds

## 2019-12-09 LAB — POCT I-STAT, CHEM 8
BUN: 87 mg/dL — ABNORMAL HIGH (ref 8–23)
BUN: 72 mg/dL — ABNORMAL HIGH (ref 8–23)
Calcium, Ion: 1.06 mmol/L — ABNORMAL LOW (ref 1.15–1.40)
Calcium, Ion: 1.05 mmol/L — ABNORMAL LOW (ref 1.15–1.40)
Chloride: 96 mmol/L — ABNORMAL LOW (ref 98–111)
Chloride: 101 mmol/L (ref 98–111)
Creatinine, Ser: 4.8 mg/dL — ABNORMAL HIGH (ref 0.61–1.24)
Creatinine, Ser: 3.8 mg/dL — ABNORMAL HIGH (ref 0.61–1.24)
Glucose, Bld: 338 mg/dL — ABNORMAL HIGH (ref 70–99)
Glucose, Bld: 151 mg/dL — ABNORMAL HIGH (ref 70–99)
HCT: 37 % — ABNORMAL LOW (ref 39.0–52.0)
HCT: 31 % — ABNORMAL LOW (ref 39.0–52.0)
Hemoglobin: 12.6 g/dL — ABNORMAL LOW (ref 13.0–17.0)
Hemoglobin: 10.5 g/dL — ABNORMAL LOW (ref 13.0–17.0)
Potassium: 4.7 mmol/L (ref 3.5–5.1)
Potassium: 4.3 mmol/L (ref 3.5–5.1)
Sodium: 129 mmol/L — ABNORMAL LOW (ref 135–145)
Sodium: 139 mmol/L (ref 135–145)
TCO2: 18 mmol/L — ABNORMAL LOW (ref 22–32)
TCO2: 25 mmol/L (ref 22–32)

## 2019-12-09 LAB — POCT I-STAT EG7
Acid-base deficit: 14 mmol/L — ABNORMAL HIGH (ref 0.0–2.0)
Acid-base deficit: 14 mmol/L — ABNORMAL HIGH (ref 0.0–2.0)
Acid-base deficit: 15 mmol/L — ABNORMAL HIGH (ref 0.0–2.0)
Bicarbonate: 15 mmol/L — ABNORMAL LOW (ref 20.0–28.0)
Bicarbonate: 15.6 mmol/L — ABNORMAL LOW (ref 20.0–28.0)
Bicarbonate: 15.6 mmol/L — ABNORMAL LOW (ref 20.0–28.0)
Bicarbonate: 28.4 mmol/L — ABNORMAL HIGH (ref 20.0–28.0)
Calcium, Ion: 1 mmol/L — ABNORMAL LOW (ref 1.15–1.40)
Calcium, Ion: 1.07 mmol/L — ABNORMAL LOW (ref 1.15–1.40)
Calcium, Ion: 1.09 mmol/L — ABNORMAL LOW (ref 1.15–1.40)
Calcium, Ion: 1.07 mmol/L — ABNORMAL LOW (ref 1.15–1.40)
HCT: 35 % — ABNORMAL LOW (ref 39.0–52.0)
HCT: 36 % — ABNORMAL LOW (ref 39.0–52.0)
HCT: 37 % — ABNORMAL LOW (ref 39.0–52.0)
HCT: 36 % — ABNORMAL LOW (ref 39.0–52.0)
Hemoglobin: 11.9 g/dL — ABNORMAL LOW (ref 13.0–17.0)
Hemoglobin: 12.2 g/dL — ABNORMAL LOW (ref 13.0–17.0)
Hemoglobin: 12.6 g/dL — ABNORMAL LOW (ref 13.0–17.0)
Hemoglobin: 12.2 g/dL — ABNORMAL LOW (ref 13.0–17.0)
O2 Saturation: 45 %
O2 Saturation: 54 %
O2 Saturation: 59 %
O2 Saturation: 42 %
Potassium: 4.4 mmol/L (ref 3.5–5.1)
Potassium: 4.7 mmol/L (ref 3.5–5.1)
Potassium: 4.8 mmol/L (ref 3.5–5.1)
Potassium: 4.2 mmol/L (ref 3.5–5.1)
Sodium: 129 mmol/L — ABNORMAL LOW (ref 135–145)
Sodium: 130 mmol/L — ABNORMAL LOW (ref 135–145)
Sodium: 132 mmol/L — ABNORMAL LOW (ref 135–145)
Sodium: 139 mmol/L (ref 135–145)
TCO2: 16 mmol/L — ABNORMAL LOW (ref 22–32)
TCO2: 17 mmol/L — ABNORMAL LOW (ref 22–32)
TCO2: 17 mmol/L — ABNORMAL LOW (ref 22–32)
TCO2: 30 mmol/L (ref 22–32)
pCO2, Ven: 46 mmHg (ref 44.0–60.0)
pCO2, Ven: 48.4 mmHg (ref 44.0–60.0)
pCO2, Ven: 54.1 mmHg (ref 44.0–60.0)
pCO2, Ven: 64.5 mmHg — ABNORMAL HIGH (ref 44.0–60.0)
pH, Ven: 7.068 — CL (ref 7.250–7.430)
pH, Ven: 7.117 — CL (ref 7.250–7.430)
pH, Ven: 7.12 — CL (ref 7.250–7.430)
pH, Ven: 7.251 (ref 7.250–7.430)
pO2, Ven: 35 mmHg (ref 32.0–45.0)
pO2, Ven: 38 mmHg (ref 32.0–45.0)
pO2, Ven: 41 mmHg (ref 32.0–45.0)
pO2, Ven: 28 mmHg — CL (ref 32.0–45.0)

## 2019-12-09 LAB — CBC
HCT: 35.4 % — ABNORMAL LOW (ref 39.0–52.0)
HCT: 34 % — ABNORMAL LOW (ref 39.0–52.0)
Hemoglobin: 12 g/dL — ABNORMAL LOW (ref 13.0–17.0)
Hemoglobin: 11.4 g/dL — ABNORMAL LOW (ref 13.0–17.0)
MCH: 30.4 pg (ref 26.0–34.0)
MCH: 31.1 pg (ref 26.0–34.0)
MCHC: 33.9 g/dL (ref 30.0–36.0)
MCHC: 33.5 g/dL (ref 30.0–36.0)
MCV: 89.6 fL (ref 80.0–100.0)
MCV: 92.6 fL (ref 80.0–100.0)
Platelets: 159 10*3/uL (ref 150–400)
Platelets: 155 10*3/uL (ref 150–400)
RBC: 3.95 MIL/uL — ABNORMAL LOW (ref 4.22–5.81)
RBC: 3.67 MIL/uL — ABNORMAL LOW (ref 4.22–5.81)
RDW: 14.5 % (ref 11.5–15.5)
RDW: 14.5 % (ref 11.5–15.5)
WBC: 24 10*3/uL — ABNORMAL HIGH (ref 4.0–10.5)
WBC: 22.6 10*3/uL — ABNORMAL HIGH (ref 4.0–10.5)
nRBC: 0 % (ref 0.0–0.2)
nRBC: 0 % (ref 0.0–0.2)

## 2019-12-09 LAB — COOXEMETRY PANEL
Carboxyhemoglobin: 0.4 % — ABNORMAL LOW (ref 0.5–1.5)
Carboxyhemoglobin: 0.7 % (ref 0.5–1.5)
Carboxyhemoglobin: 0.7 % (ref 0.5–1.5)
Carboxyhemoglobin: 1.1 % (ref 0.5–1.5)
Carboxyhemoglobin: 1.1 % (ref 0.5–1.5)
Carboxyhemoglobin: 1.2 % (ref 0.5–1.5)
Carboxyhemoglobin: 1.2 % (ref 0.5–1.5)
Methemoglobin: 0.5 % (ref 0.0–1.5)
Methemoglobin: 0.6 % (ref 0.0–1.5)
Methemoglobin: 0.6 % (ref 0.0–1.5)
Methemoglobin: 0.7 % (ref 0.0–1.5)
Methemoglobin: 0.7 % (ref 0.0–1.5)
Methemoglobin: 1.1 % (ref 0.0–1.5)
Methemoglobin: 1.2 % (ref 0.0–1.5)
O2 Saturation: 17.3 %
O2 Saturation: 38.1 %
O2 Saturation: 40.6 %
O2 Saturation: 54.9 %
O2 Saturation: 62.9 %
O2 Saturation: 65 %
O2 Saturation: 68.4 %
Total hemoglobin: 10.1 g/dL — ABNORMAL LOW (ref 12.0–16.0)
Total hemoglobin: 10.7 g/dL — ABNORMAL LOW (ref 12.0–16.0)
Total hemoglobin: 11.1 g/dL — ABNORMAL LOW (ref 12.0–16.0)
Total hemoglobin: 11.3 g/dL — ABNORMAL LOW (ref 12.0–16.0)
Total hemoglobin: 12.5 g/dL (ref 12.0–16.0)
Total hemoglobin: 13.1 g/dL (ref 12.0–16.0)
Total hemoglobin: 15.3 g/dL (ref 12.0–16.0)

## 2019-12-09 LAB — HEMOGLOBIN A1C
Hgb A1c MFr Bld: 8.3 % — ABNORMAL HIGH (ref 4.8–5.6)
Mean Plasma Glucose: 191.51 mg/dL

## 2019-12-09 LAB — RENAL FUNCTION PANEL
Albumin: 2.5 g/dL — ABNORMAL LOW (ref 3.5–5.0)
Anion gap: 15 (ref 5–15)
BUN: 62 mg/dL — ABNORMAL HIGH (ref 8–23)
CO2: 20 mmol/L — ABNORMAL LOW (ref 22–32)
Calcium: 7.3 mg/dL — ABNORMAL LOW (ref 8.9–10.3)
Chloride: 101 mmol/L (ref 98–111)
Creatinine, Ser: 3.51 mg/dL — ABNORMAL HIGH (ref 0.61–1.24)
GFR calc Af Amer: 19 mL/min — ABNORMAL LOW (ref >60)
GFR calc non Af Amer: 16 mL/min — ABNORMAL LOW (ref >60)
Glucose, Bld: 141 mg/dL — ABNORMAL HIGH (ref 70–99)
Phosphorus: 7 mg/dL — ABNORMAL HIGH (ref 2.5–4.6)
Potassium: 4.1 mmol/L (ref 3.5–5.1)
Sodium: 136 mmol/L (ref 135–145)

## 2019-12-09 LAB — TROPONIN I (HIGH SENSITIVITY): Troponin I (High Sensitivity): >27000 ng/L (ref <18)

## 2019-12-09 LAB — BASIC METABOLIC PANEL
Anion gap: 19 — ABNORMAL HIGH (ref 5–15)
BUN: 75 mg/dL — ABNORMAL HIGH (ref 8–23)
CO2: 18 mmol/L — ABNORMAL LOW (ref 22–32)
Calcium: 9 mg/dL (ref 8.9–10.3)
Chloride: 98 mmol/L (ref 98–111)
Creatinine, Ser: 5.44 mg/dL — ABNORMAL HIGH (ref 0.61–1.24)
GFR calc Af Amer: 11 mL/min — ABNORMAL LOW (ref >60)
GFR calc non Af Amer: 10 mL/min — ABNORMAL LOW (ref >60)
Glucose, Bld: 341 mg/dL — ABNORMAL HIGH (ref 70–99)
Potassium: 4.8 mmol/L (ref 3.5–5.1)
Sodium: 135 mmol/L (ref 135–145)

## 2019-12-09 LAB — ECHOCARDIOGRAM COMPLETE
Height: 71 in
Weight: 4848 oz

## 2019-12-09 LAB — PROTIME-INR
INR: 1.5 — ABNORMAL HIGH (ref 0.8–1.2)
Prothrombin Time: 18.1 seconds — ABNORMAL HIGH (ref 11.4–15.2)

## 2019-12-09 LAB — ABO/RH: ABO/RH(D): O POS

## 2019-12-09 LAB — TYPE AND SCREEN
ABO/RH(D): O POS
Antibody Screen: NEGATIVE

## 2019-12-09 LAB — MAGNESIUM: Magnesium: 2.3 mg/dL (ref 1.7–2.4)

## 2019-12-09 LAB — FIBRINOGEN: Fibrinogen: 688 mg/dL — ABNORMAL HIGH (ref 210–475)

## 2019-12-09 SURGERY — TEMPORARY PACEMAKER
Anesthesia: LOCAL

## 2019-12-09 SURGERY — VENTRICULAR ASSIST DEVICE INSERTION
Anesthesia: LOCAL

## 2019-12-09 MED ORDER — CLOPIDOGREL BISULFATE 75 MG PO TABS: 75.0000 mg | ORAL_TABLET | Freq: Every day | ORAL | Status: DC

## 2019-12-09 MED ORDER — FENTANYL CITRATE (PF) 100 MCG/2ML IJ SOLN
INTRAMUSCULAR | Status: AC
  Filled 2019-12-09: qty 2

## 2019-12-09 MED ORDER — MIDAZOLAM HCL 2 MG/2ML IJ SOLN
INTRAMUSCULAR | Status: AC
  Filled 2019-12-09: qty 2

## 2019-12-09 MED ORDER — CALCIUM GLUCONATE-NACL 1-0.675 GM/50ML-% IV SOLN
1.0000 g | Freq: Once | INTRAVENOUS | Status: AC
  Administered 2019-12-09: 1000 mg via INTRAVENOUS
  Filled 2019-12-09: qty 50

## 2019-12-09 MED ORDER — FAMOTIDINE IN NACL 20-0.9 MG/50ML-% IV SOLN
20.0000 mg | INTRAVENOUS | Status: DC
  Administered 2019-12-09: 20 mg via INTRAVENOUS
  Filled 2019-12-09: qty 50

## 2019-12-09 MED ORDER — MIDAZOLAM HCL 2 MG/2ML IJ SOLN
INTRAMUSCULAR | Status: DC | PRN
  Administered 2019-12-09: 2 mg via INTRAVENOUS
  Administered 2019-12-09 (×3): 1 mg via INTRAVENOUS
  Administered 2019-12-09: 2 mg via INTRAVENOUS

## 2019-12-09 MED ORDER — ONDANSETRON HCL 4 MG/2ML IJ SOLN: 4.0000 mg | Freq: Four times a day (QID) | INTRAMUSCULAR | Status: DC | PRN

## 2019-12-09 MED ORDER — SODIUM CHLORIDE 0.9% FLUSH
3.0000 mL | Freq: Two times a day (BID) | INTRAVENOUS | Status: DC
  Administered 2019-12-10: 3 mL via INTRAVENOUS

## 2019-12-09 MED ORDER — HYDRALAZINE HCL 20 MG/ML IJ SOLN: 10.0000 mg | INTRAMUSCULAR | Status: DC | PRN

## 2019-12-09 MED ORDER — LIDOCAINE HCL (PF) 1 % IJ SOLN
INTRAMUSCULAR | Status: DC | PRN
  Administered 2019-12-09: 15 mL via INTRADERMAL

## 2019-12-09 MED ORDER — HEPARIN (PORCINE) 25000 UT/250ML-% IV SOLN
200.0000 [IU]/h | INTRAVENOUS | Status: DC
  Administered 2019-12-09: 200 [IU]/h via INTRAVENOUS
  Filled 2019-12-09: qty 250

## 2019-12-09 MED ORDER — SODIUM CHLORIDE 0.9% FLUSH
10.0000 mL | Freq: Two times a day (BID) | INTRAVENOUS | Status: DC
  Administered 2019-12-09: 10 mL

## 2019-12-09 MED ORDER — ORAL CARE MOUTH RINSE
15.0000 mL | OROMUCOSAL | Status: DC
  Administered 2019-12-09 – 2019-12-10 (×7): 15 mL via OROMUCOSAL

## 2019-12-09 MED ORDER — EPINEPHRINE HCL 5 MG/250ML IV SOLN IN NS
50.0000 ug/min | INTRAVENOUS | Status: DC
  Administered 2019-12-09: 10 ug/min via INTRAVENOUS
  Administered 2019-12-09: 2 ug/min via INTRAVENOUS
  Administered 2019-12-10 (×2): 50 ug/min via INTRAVENOUS
  Administered 2019-12-10: 5.333 ug/min via INTRAVENOUS
  Filled 2019-12-09 (×7): qty 250

## 2019-12-09 MED ORDER — HEPARIN SODIUM (PORCINE) 1000 UNIT/ML IJ SOLN
INTRAMUSCULAR | Status: AC
  Filled 2019-12-09: qty 1

## 2019-12-09 MED ORDER — LIDOCAINE HCL (PF) 1 % IJ SOLN
INTRAMUSCULAR | Status: AC
  Filled 2019-12-09: qty 30

## 2019-12-09 MED ORDER — HEPARIN SODIUM (PORCINE) 1000 UNIT/ML IJ SOLN
INTRAMUSCULAR | Status: DC | PRN
  Administered 2019-12-09: 5000 [IU] via INTRAVENOUS
  Administered 2019-12-09: 10000 [IU] via INTRAVENOUS

## 2019-12-09 MED ORDER — PRISMASOL BGK 4/2.5 32-4-2.5 MEQ/L REPLACEMENT SOLN
Status: DC
  Administered 2019-12-09: 10:00:00 via INTRAVENOUS_CENTRAL

## 2019-12-09 MED ORDER — ACETAMINOPHEN 325 MG PO TABS: 650.0000 mg | ORAL_TABLET | ORAL | Status: DC | PRN

## 2019-12-09 MED ORDER — HEPARIN SODIUM (PORCINE) 1000 UNIT/ML IJ SOLN
INTRAMUSCULAR | Status: AC
  Filled 2019-12-09: qty 3

## 2019-12-09 MED ORDER — INSULIN REGULAR(HUMAN) IN NACL 100-0.9 UT/100ML-% IV SOLN
INTRAVENOUS | Status: DC
  Administered 2019-12-09: 17 [IU]/h via INTRAVENOUS
  Administered 2019-12-09: 11.5 [IU]/h via INTRAVENOUS
  Filled 2019-12-09 (×2): qty 100

## 2019-12-09 MED ORDER — DOPAMINE-DEXTROSE 3.2-5 MG/ML-% IV SOLN
0.0000 ug/kg/min | INTRAVENOUS | Status: DC
  Administered 2019-12-09: 2.5 ug/kg/min via INTRAVENOUS
  Filled 2019-12-09: qty 250

## 2019-12-09 MED ORDER — PERFLUTREN LIPID MICROSPHERE
1.0000 mL | INTRAVENOUS | Status: AC | PRN
  Administered 2019-12-09: 3 mL via INTRAVENOUS
  Filled 2019-12-09: qty 10

## 2019-12-09 MED ORDER — SODIUM CHLORIDE 0.9 % IV SOLN: 250.0000 mL | INTRAVENOUS | Status: DC | PRN

## 2019-12-09 MED ORDER — SODIUM BICARBONATE 8.4 % IV SOLN
INTRAVENOUS | Status: DC | PRN
  Administered 2019-12-09: 100 meq via INTRAVENOUS

## 2019-12-09 MED ORDER — LIDOCAINE-EPINEPHRINE 1 %-1:100000 IJ SOLN
INTRAMUSCULAR | Status: AC
  Filled 2019-12-09: qty 1

## 2019-12-09 MED ORDER — LIDOCAINE HCL (PF) 1 % IJ SOLN
INTRAMUSCULAR | Status: DC | PRN
  Administered 2019-12-09 (×5): 15 mL

## 2019-12-09 MED ORDER — LABETALOL HCL 5 MG/ML IV SOLN: 10.0000 mg | INTRAVENOUS | Status: DC | PRN

## 2019-12-09 MED ORDER — SODIUM BICARBONATE 8.4 % IV SOLN
INTRAVENOUS | Status: AC
  Administered 2019-12-09: 08:00:00
  Filled 2019-12-09: qty 100

## 2019-12-09 MED ORDER — HEPARIN (PORCINE) IN NACL 1000-0.9 UT/500ML-% IV SOLN
INTRAVENOUS | Status: AC
  Filled 2019-12-09: qty 500

## 2019-12-09 MED ORDER — INSULIN ASPART 100 UNIT/ML IV SOLN: 10.0000 [IU] | Freq: Once | INTRAVENOUS | Status: DC

## 2019-12-09 MED ORDER — SODIUM CHLORIDE 0.9 % IV SOLN
INTRAVENOUS | Status: AC | PRN
  Administered 2019-12-09: 10 mL/h via INTRAVENOUS

## 2019-12-09 MED ORDER — VASOPRESSIN 20 UNIT/ML IV SOLN
0.0300 [IU]/min | INTRAVENOUS | Status: DC
  Administered 2019-12-09: 0.03 [IU]/min via INTRAVENOUS
  Filled 2019-12-09: qty 2

## 2019-12-09 MED ORDER — FENTANYL 2500MCG IN NS 250ML (10MCG/ML) PREMIX INFUSION
0.0000 ug/h | INTRAVENOUS | Status: DC
  Administered 2019-12-09: 150 ug/h via INTRAVENOUS
  Filled 2019-12-09 (×3): qty 250

## 2019-12-09 MED ORDER — PRISMASOL BGK 0/2.5 32-2.5 MEQ/L IV SOLN: INTRAVENOUS | Status: DC

## 2019-12-09 MED ORDER — HEPARIN (PORCINE) IN NACL 1000-0.9 UT/500ML-% IV SOLN
INTRAVENOUS | Status: DC | PRN
  Administered 2019-12-09 (×2): 500 mL

## 2019-12-09 MED ORDER — HEPARIN SODIUM (PORCINE) 5000 UNIT/ML IJ SOLN
50000.0000 [IU] | INTRAVENOUS | Status: DC
  Administered 2019-12-09: 50000 [IU]
  Filled 2019-12-09: qty 10

## 2019-12-09 MED ORDER — PRISMASOL BGK 0/2.5 32-2.5 MEQ/L IV SOLN
INTRAVENOUS | Status: DC
  Administered 2019-12-09 – 2019-12-10 (×6): via INTRAVENOUS_CENTRAL
  Filled 2019-12-09 (×20): qty 5000

## 2019-12-09 MED ORDER — SODIUM BICARBONATE 8.4 % IV SOLN
INTRAVENOUS | Status: AC
  Administered 2019-12-09: 09:00:00
  Filled 2019-12-09: qty 100

## 2019-12-09 MED ORDER — HEPARIN SODIUM (PORCINE) 1000 UNIT/ML DIALYSIS
1000.0000 [IU] | INTRAMUSCULAR | Status: DC | PRN
  Filled 2019-12-09: qty 1
  Filled 2019-12-09: qty 3
  Filled 2019-12-09: qty 6

## 2019-12-09 MED ORDER — LABETALOL HCL 5 MG/ML IV SOLN: 10.0000 mg | INTRAVENOUS | Status: AC | PRN

## 2019-12-09 MED ORDER — HEPARIN (PORCINE) IN NACL 1000-0.9 UT/500ML-% IV SOLN
INTRAVENOUS | Status: DC | PRN
  Administered 2019-12-09: 500 mL

## 2019-12-09 MED ORDER — MILRINONE LACTATE IN DEXTROSE 20-5 MG/100ML-% IV SOLN
0.1250 ug/kg/min | INTRAVENOUS | Status: DC
  Administered 2019-12-09 – 2019-12-10 (×2): 0.125 ug/kg/min via INTRAVENOUS
  Filled 2019-12-09: qty 100

## 2019-12-09 MED ORDER — SODIUM BICARBONATE 8.4 % IV SOLN
25.0000 meq | Freq: Once | INTRAVENOUS | Status: AC
  Administered 2019-12-09: 25 meq via INTRAVENOUS
  Filled 2019-12-09: qty 50

## 2019-12-09 MED ORDER — MIDAZOLAM HCL 2 MG/2ML IJ SOLN: 2.0000 mg | Freq: Once | INTRAMUSCULAR | Status: DC

## 2019-12-09 MED ORDER — SODIUM CHLORIDE 0.9% FLUSH: 3.0000 mL | INTRAVENOUS | Status: DC | PRN

## 2019-12-09 MED ORDER — DEXTROSE 50 % IV SOLN: 1.0000 | Freq: Once | INTRAVENOUS | Status: DC

## 2019-12-09 MED ORDER — SUCCINYLCHOLINE CHLORIDE 20 MG/ML IJ SOLN
INTRAMUSCULAR | Status: DC | PRN
  Administered 2019-12-09: 160 mg via INTRAVENOUS

## 2019-12-09 MED ORDER — FENTANYL CITRATE (PF) 100 MCG/2ML IJ SOLN
INTRAMUSCULAR | Status: DC | PRN
  Administered 2019-12-09 (×3): 50 ug via INTRAVENOUS
  Administered 2019-12-09: 25 ug via INTRAVENOUS

## 2019-12-09 MED ORDER — DEXTROSE 50 % IV SOLN: 0.0000 mL | INTRAVENOUS | Status: DC | PRN

## 2019-12-09 MED ORDER — HEPARIN BOLUS VIA INFUSION (CRRT)
1000.0000 [IU] | INTRAVENOUS | Status: DC | PRN
  Administered 2019-12-09 (×4): 1000 [IU] via INTRAVENOUS_CENTRAL
  Filled 2019-12-09: qty 1000

## 2019-12-09 MED ORDER — SODIUM CHLORIDE 0.9 % IV SOLN
250.0000 [IU]/h | INTRAVENOUS | Status: DC
  Administered 2019-12-09: 1050 [IU]/h via INTRAVENOUS_CENTRAL
  Administered 2019-12-09: 250 [IU]/h via INTRAVENOUS_CENTRAL
  Filled 2019-12-09 (×3): qty 2

## 2019-12-09 MED ORDER — MIDAZOLAM 50MG/50ML (1MG/ML) PREMIX INFUSION
0.0000 mg/h | INTRAVENOUS | Status: DC
  Administered 2019-12-09: 0 mg/h via INTRAVENOUS
  Administered 2019-12-09 (×2): 4 mg/h via INTRAVENOUS
  Filled 2019-12-09 (×3): qty 50

## 2019-12-09 MED ORDER — PROPOFOL 10 MG/ML IV BOLUS
INTRAVENOUS | Status: DC | PRN
  Administered 2019-12-09: 100 mg via INTRAVENOUS

## 2019-12-09 MED ORDER — ASPIRIN 81 MG PO CHEW: 81.0000 mg | CHEWABLE_TABLET | Freq: Every day | ORAL | Status: DC

## 2019-12-09 MED ORDER — DEXTROSE 5 % SOLN FOR IMPELLA PURGE CATHETER
INTRAVENOUS | Status: DC
  Filled 2019-12-09: qty 1000

## 2019-12-09 MED ORDER — NOREPINEPHRINE 16 MG/250ML-% IV SOLN
0.0000 ug/min | INTRAVENOUS | Status: DC
  Administered 2019-12-09: 35 ug/min via INTRAVENOUS
  Administered 2019-12-09: 40 ug/min via INTRAVENOUS
  Administered 2019-12-09: 29 ug/min via INTRAVENOUS
  Administered 2019-12-10: 40 ug/min via INTRAVENOUS
  Administered 2019-12-10: 60 ug/min via INTRAVENOUS
  Filled 2019-12-09 (×5): qty 250

## 2019-12-09 MED ORDER — CHLORHEXIDINE GLUCONATE CLOTH 2 % EX PADS: 6.0000 | MEDICATED_PAD | Freq: Every day | CUTANEOUS | Status: DC

## 2019-12-09 MED ORDER — SODIUM CHLORIDE 0.9% FLUSH: 10.0000 mL | INTRAVENOUS | Status: DC | PRN

## 2019-12-09 MED ORDER — HEPARIN (PORCINE) IN NACL 2-0.9 UNITS/ML: INTRAMUSCULAR | Status: DC | PRN

## 2019-12-09 MED ORDER — CHLORHEXIDINE GLUCONATE 0.12% ORAL RINSE (MEDLINE KIT)
15.0000 mL | Freq: Two times a day (BID) | OROMUCOSAL | Status: DC
  Administered 2019-12-09 – 2019-12-10 (×3): 15 mL via OROMUCOSAL

## 2019-12-09 MED ORDER — SODIUM CHLORIDE 0.9 % IV SOLN
1.0000 g | Freq: Once | INTRAVENOUS | Status: AC
  Administered 2019-12-09: 1 g via INTRAVENOUS
  Filled 2019-12-09: qty 10

## 2019-12-09 MED ORDER — DEXTROSE 50 % IV SOLN
1.0000 | Freq: Once | INTRAVENOUS | Status: AC
  Administered 2019-12-09: 50 mL via INTRAVENOUS
  Filled 2019-12-09: qty 50

## 2019-12-09 MED ORDER — DOPAMINE-DEXTROSE 3.2-5 MG/ML-% IV SOLN
INTRAVENOUS | Status: AC
  Filled 2019-12-09: qty 250

## 2019-12-09 MED ORDER — HYDRALAZINE HCL 20 MG/ML IJ SOLN: 10.0000 mg | INTRAMUSCULAR | Status: AC | PRN

## 2019-12-09 SURGICAL SUPPLY — 11 items
CATH BALLN WEDGE 5F 110CM (CATHETERS) ×1 IMPLANT
CATH EXPO 5F MPA-1 (CATHETERS) ×1 IMPLANT
CATH TRIALYSIS 20CM 13F 3LUM (CATHETERS) ×1 IMPLANT
ELECT DEFIB PAD ADLT CADENCE (PAD) ×1 IMPLANT
HOVERMATT SINGLE USE (MISCELLANEOUS) ×1 IMPLANT
PACK CARDIAC CATHETERIZATION (CUSTOM PROCEDURE TRAY) ×1 IMPLANT
PROTECTION STATION PRESSURIZED (MISCELLANEOUS) ×3
PUMP SET IMPELLA RP US (CATHETERS) ×1 IMPLANT
SHEATH FAST CATH 14F (SHEATH) ×1 IMPLANT
STATION PROTECTION PRESSURIZED (MISCELLANEOUS) IMPLANT
WIRE EMERALD 3MM-J .025X260CM (WIRE) ×1 IMPLANT

## 2019-12-09 SURGICAL SUPPLY — 12 items
CABLE ADAPT PACING TEMP 12FT (ADAPTER) ×1 IMPLANT
CATH S G BIP PACING (CATHETERS) ×1 IMPLANT
CATH SWAN GANZ VIP 7.5F (CATHETERS) ×1 IMPLANT
GUIDEWIRE INQWIRE 1.5J.035X260 (WIRE) IMPLANT
HOVERMATT SINGLE USE (MISCELLANEOUS) ×1 IMPLANT
INQWIRE 1.5J .035X260CM (WIRE) ×3
PACK CARDIAC CATHETERIZATION (CUSTOM PROCEDURE TRAY) ×1 IMPLANT
SHEATH PINNACLE 6F 10CM (SHEATH) ×1 IMPLANT
SHEATH PINNACLE 8F 10CM (SHEATH) ×1 IMPLANT
SHEATH PROBE COVER 6X72 (BAG) ×2 IMPLANT
SLEEVE REPOSITIONING LENGTH 30 (MISCELLANEOUS) ×2 IMPLANT
WIRE EMERALD 3MM-J .025X260CM (WIRE) ×1 IMPLANT

## 2019-12-09 NOTE — Procedures (Signed)
Arterial Catheter Insertion Procedure Note Clarence Estrada [id number] 10/23/1946  Procedure: Insertion of Arterial Catheter  Indications: Blood pressure monitoring  Procedure Details Consent: Risks of procedure as well as the alternatives and risks of each were explained to the (patient/caregiver).  Consent for procedure obtained. Time Out: Verified patient identification, verified procedure, site/side was marked, verified correct patient position, special equipment/implants available, medications/allergies/relevent history reviewed, required imaging and test results available.  Performed  Maximum sterile technique was used including antiseptics, cap, gloves, gown, hand hygiene, mask and sheet. Skin prep: Chlorhexidine; local anesthetic administered 20 gauge catheter was inserted into left radial artery using the Seldinger technique. ULTRASOUND GUIDANCE USED: NO Evaluation Blood flow good; BP tracing good. Complications: No apparent complications.    Lynann Bologna 12/03/2019

## 2019-12-09 NOTE — Progress Notes (Signed)
eLink Physician-Brief Progress Note Patient Name: [name] DOB: 05-06-1946 MRN: [id number]   Date of Service  12/07/2019  HPI/Events of Note  Notified of ABG 7.31/49/117 on PRVC 16/600 (8cc/kg)/40%/5 PEEP, MV 11, peak pressure 24  eICU Interventions  Increase rate to 20 to maintain MV 12-14     Intervention Category Major Interventions: Respiratory failure - evaluation and management  Judd Lien 12/05/2019, 10:11 PM

## 2019-12-09 NOTE — Progress Notes (Signed)
  73 y/o male with OSA and diabetes admitted earlier today with large OOH inferoposterior MI with RV involvement c/b CHB and AKI. Based on history initial evant seems to be on Sunday but Trop still > 27k so may have been more recent.  Taken emergently to cath lab by Dr. Ellyn Hack at 530p this evening. RCA totally occluded ostially. Able to perform PCI with heavy clot burden and minimal restoration of flow. LAD system only mild diffuse disease. Procedure then aborted due to severe back pain and inability to lie flat on the table. ECHO LVEF 40-45% (poor windows) with severely dilated and HK RV.   Remained hypotensive despite high-dose NE and VP. Placed on epi 10 with improvement in SBP but developed repeated episodes of asystole with LOC.  I came in and evaluated patient and he was pale and diaphoretic for SBP in 90-100 range. Currently transcutaneously pacing.  He is anuric with creatinine 4.9 with K 6.0  Will plan to take to cath lab for emergent TVP and PA catheter placement.   Suspect he will also need HD catheter and possible RP impella support.   I have discussed with him and his wife and informed them of the severity of his illness and the possibility that he may not survive this event.   As well as the critical care team as he will need to be intubated and sedated for any procedures that require him to lie flat for a prolonged period of time.   Critical care time 60 minutes.   Glori Bickers, MD  4:11 AM

## 2019-12-09 NOTE — H&P (View-Only) (Signed)
  I have followed him very closely throughout the day.   Remains on vent   Now on epi 8 and NE 35 and VP 0.03. SBP 130  Swan numbers done personally. CI 1.1 PAPi 0.5. CPO 0.6. Anuric. On CVVHD   Labs back with worsening acidosis.   Decision made to take back to cath lab for mechanical RV support with Impella RP.   Discussed with his wife in detail.   Additional CCT 60+ mins  Glori Bickers, MD  4:25 PM

## 2019-12-09 NOTE — Progress Notes (Signed)
Orthopedic Tech Progress Note Patient Details:  Clarence Estrada 09/21/1946 [id number] CATH LAB RN called requesting a KNEE IMMOBILIZER to be dropped off at desk [employer] Type of Ortho Device: Knee Immobilizer Ortho Device/Splint Interventions: Other (comment)   Post Interventions Patient Tolerated: Other (comment) Instructions Provided: Other (comment)   [name] 12/18/19, 6:54 PM

## 2019-12-09 NOTE — Anesthesia Procedure Notes (Signed)
Procedure Name: Intubation Date/Time: 2019/12/18 6:51 AM Performed by: Griffin Dakin, CRNA Pre-anesthesia Checklist: Patient identified, Emergency Drugs available, Suction available and Patient being monitored Patient Re-evaluated:Patient Re-evaluated prior to induction Oxygen Delivery Method: Ambu bag Preoxygenation: Pre-oxygenation with 100% oxygen Induction Type: IV induction Ventilation: Mask ventilation without difficulty Laryngoscope Size: Glidescope and 4 Grade View: Grade II Tube type: Oral Tube size: 7.5 mm Number of attempts: 1 Airway Equipment and Method: Stylet and Oral airway Placement Confirmation: ETT inserted through vocal cords under direct vision,  positive ETCO2,  breath sounds checked- equal and bilateral and CO2 detector Secured at: 21 cm Tube secured with: Tape Dental Injury: Teeth and Oropharynx as per pre-operative assessment

## 2019-12-09 NOTE — Progress Notes (Signed)
Patient was transported to the CATH lab and back to [facility] without any complications.

## 2019-12-09 NOTE — Progress Notes (Signed)
  I have followed him very closely throughout the day.   Remains on vent   Now on epi 8 and NE 35 and VP 0.03. SBP 130  Swan numbers done personally. CI 1.1 PAPi 0.5. CPO 0.6. Anuric. On CVVHD   Labs back with worsening acidosis.   Decision made to take back to cath lab for mechanical RV support with Impella RP.   Discussed with his wife in detail.   Additional CCT 60+ mins  Daniel Bensimhon, MD  4:25 PM   

## 2019-12-09 NOTE — Progress Notes (Signed)
  Echocardiogram 2D Echocardiogram has been performed with Definity.  Clarence Estrada 12/11/2019, 10:11 AM

## 2019-12-09 NOTE — Progress Notes (Signed)
Nutrition Follow-up  DOCUMENTATION CODES:   Morbid obesity  INTERVENTION:   If unable to extubate within 24-48 hours, recommend initiation of TF  Tube Feeding: Vital AF 1.2 at 20 ml/hr, goal 55 ml/hr Pro-Stat 30 mL 4 times daily Provides 1984 kcals, 159 g of protein and 1069 mL of free water  Add B-complex with C   NUTRITION DIAGNOSIS:   Inadequate oral intake related to acute illness as evidenced by NPO status.  GOAL:   Patient will meet greater than or equal to 90% of their needs  MONITOR:   Vent status, Labs, Weight trends, TF tolerance, Skin  REASON FOR ASSESSMENT:   Ventilator    ASSESSMENT:   73 yo male admitted with STEMI with complete heart block, cardiogenic shock, AKI on CKD requiring intiation of CRRT, respiratory failure requiring intubation. PMH includes OSA, DM, HTN  RD working remotely.  12/10 Admit with MI, RCA occlusion, PCI with minimal restoration 12/11 Cath Lab for emergent TVP and PA catheter; CRRT initated, Intubated  Pt with multiple asystolic events overnight despite pressors and pacing Patient is currently intubated on ventilator support MV: 9.6 L/min Temp (24hrs), Avg:97.6 F (36.4 C), Min:95.5 F (35.3 C), Max:99.8 F (37.7 C)  Unable to obtain diet and weight history from patient at this time  OG tube  Labs: CBGs 275-298 Meds: insulin drip   Diet Order:   Diet Order            Diet NPO time specified  Diet effective now              EDUCATION NEEDS:   Not appropriate for education at this time  Skin:  Skin Assessment: Reviewed RN Assessment  Last BM:  12/9  Height:   Ht Readings from Last 1 Encounters:  12/26/2019 5\' 11"  (1.803 m)    Weight:   Wt Readings from Last 1 Encounters:  12/07/2019 (!) 137.4 kg    Ideal Body Weight:  78.2 kg  BMI:  Body mass index is 42.26 kg/m.  Estimated Nutritional Needs:   Kcal:  3154-0086 kcals  Protein:  156-196 g  Fluid:  >/1.7 L   Kerman Passey MS, RDN, LDN,  CNSC [phone] Pager  [phone] Weekend/On-Call Pager

## 2019-12-09 NOTE — Procedures (Signed)
Central Venous Catheter Insertion Procedure Note CAI ANFINSON [id number] May 13, 1946  Procedure: Insertion of Central Venous Catheter Indications: Drug and/or fluid administration and Frequent blood sampling  Procedure Details Consent: Risks of procedure as well as the alternatives and risks of each were explained to the (patient/caregiver).  Consent for procedure obtained. Time Out: Verified patient identification, verified procedure, site/side was marked, verified correct patient position, special equipment/implants available, medications/allergies/relevent history reviewed, required imaging and test results available.  Performed  Maximum sterile technique was used including antiseptics, cap, gloves, gown, hand hygiene, mask and sheet. Skin prep: chloraprep; local anesthetic administered A antimicrobial bonded/coated triple lumen catheter was placed in the right internal jugular vein using the Seldinger technique.  Evaluation Blood flow good Complications: No apparent complications Patient did tolerate procedure well. Chest X-ray ordered to verify placement.  CXR: normal.  Maryjane Hurter 12/01/2019, 12:27 AM

## 2019-12-09 NOTE — Progress Notes (Signed)
Patient ID: Clarence Estrada, male   DOB: 12-12-1946, 73 y.o.   MRN: [id number] [facility] Progress Note   Assessment/ Plan:   1. Acute kidney Injury on chronic kidney disease stage III: Currently anuric and with worsening hyperkalemia metabolic acidosis for which CRRT has been ordered.  Hemodialysis catheter in situ with initial goal being for regulation of multiple metabolic abnormalities prior to undertaking any ultrafiltration with current volume status/pressor dependency.  There may be an delay in acquiring CRRT machine because all the machines are currently in use on this campus. 2.  Acute inferior ST elevation MI with complete heart block: Ongoing medical management at this time and now status post transvenous pacemaker. 3.  Hyperkalemia: Secondary to acute kidney injury/shock, undertake CRRT at this time. 4.  Hypotension/shock/anion gap metabolic acidosis: Begin CRRT.  Subjective:   Yesterday underwent coronary angiography showing total RCA occlusion with inability to perform PCI.  Overnight hypotensive and developed asystole now status post transvenous pacemaker and PA catheter placement along with HD catheter.   Objective:   BP (!) 89/51   Pulse (!) 172   Temp 99.4 F (37.4 C) (Oral)   Resp (!) 21   Ht 5\' 11"  (1.803 m)   Wt (!) 137.4 kg   SpO2 97%   BMI 42.26 kg/m   Intake/Output Summary (Last 24 hours) at 12/06/2019 0804 Last data filed at 12/23/2019 0100 Gross per 24 hour  Intake 1302.73 ml  Output --  Net 1302.73 ml   Weight change:   Physical Exam: Gen: Intubated, appears agitated/uncomfortable, awaiting sedation CVS: Pulse regular rhythm, normal rate, S1 and S2 normal Resp: Clear to auscultation, on ventilator Abd: Obese, nontender, bowel sounds normal Ext: 1+ bilateral lower extremity edema  Imaging: CARDIAC CATHETERIZATION  Result Date: 12/13/2019 Findings: RA = 19 PA = 40/10 (28) PCW = 22 Fick cardiac output/index = 4.8/1.9 PVR = 1.3 WU Ao sat  = 99% PA sat = 56%, 53% Assessment: 1. Cardiogenic shock due to RV failure in setting of subacute inferior-posterior MI 2. CHB due to #1 3. Acute renal failure 4. Acute hypoxic respiratory failure Plan/Discussion: Will continue temporary pacing and hemodynamic support. Proceed with HD. If develops worsening shock, consider RP Impella. 14/10/2019, MD 7:01 AM  CARDIAC CATHETERIZATION  Result Date: 12-30-19  Prox RCA to Mid RCA lesion is 100% stenosed.  Mid RCA to Dist RCA lesion is 100% stenosed with 100% stenosed side branch in RPAV.  Post intervention, there is a 99% residual stenosis.  Balloon angioplasty was performed using a BALLOON SAPPHIRE 2.5X12.  SUMMARY  Culprit lesion: 100% near ostial occlusion of the RCA.  Very difficult to cross the wire, but unable to open with angioplasty and thrombectomy due to extensive clot burden. ->  Will likely have completed RCA infarct  PCI attempt aborted after wire positioning lost and guide catheter movement led to apparent focal aortic dissection at the RCA ostium. --->  Anticoagulation discontinued and stat echo ordered.  CT surgery consulted  Moderately elevated LVEDP in setting of Inferior and True Posterior ST elevation MI, likely RV infarct  Severe acute renal failure with creatinine of 4.18-in part related to underlying diabetic nephropathy in conjunction with acute tubular necrosis from dehydration  Complete heart block with controlled junctional rate. RECOMMENDATIONS  Admit to ICU.  Anticoagulation discontinued, will use subcu heparin.  Stat bedside echocardiogram showed moderately reduced EF, most notably dilated RV.  No obvious dissection on bedside view.  Will await formal read.  Full  echocardiogram ordered for the morning.  Will likely need Definity contrast to assess LV function.  Continue IV Levophed to maintain systolic pressures over 496 and MAP greater than 65.  We will hold off on cardiac CTA given renal insufficiency and  relatively small area on cath.  Have reviewed with Dr. Arvid Right from [facility].  Will hydrate with IV fluids (has not been drinking since Sunday), but with elevated LVEDP will also need to diurese.  Have written for 1 dose of IV Lasix, and will defer further titration to rounding team and nephrology.  I do suspect that he may need a dose tonight. The patient is critically ill, however is on nasal cannula, chest pain is relatively controlled at 2-3 out of 10.  Back pain notably improved upon completion of the procedure and getting him off of the table.  This reduces concern of the back pain being related to dissection. David Harding, M.D., M.S. Interventional Cardiologist Pager # [phone] Phone # [phone] [address] , [place] [address]  PERIPHERAL VASCULAR CATHETERIZATION  Result Date: 12/23/2019 Findings: RA = 19 PA = 40/10 (28) PCW = 22 Fick cardiac output/index = 4.8/1.9 PVR = 1.3 WU Ao sat = 99% PA sat = 56%, 53% Assessment: 1. Cardiogenic shock due to RV failure in setting of subacute inferior-posterior MI 2. CHB due to #1 3. Acute renal failure 4. Acute hypoxic respiratory failure Plan/Discussion: Will continue temporary pacing and hemodynamic support. Proceed with HD. If develops worsening shock, consider RP Impella. Daniel Bensimhon, MD 7:01 AM  DG CHEST PORT 1 VIEW  Result Date: 12/22/2019 CLINICAL DATA:  Cath lab intervention. Chest pain. EXAM: PORTABLE CHEST 1 VIEW COMPARISON:  Radiograph yesterday. FINDINGS: Swan-Ganz catheter in place with tip in the region of the main pulmonary outflow tract. Right internal jugular central venous catheter, tip obscured by overlying monitoring devices. The heart is enlarged. Presumed temporary pacemaker leads. Low lung volumes with bronchovascular crowding. No pneumothorax. No large pleural effusion. IMPRESSION: 1. Swan-Ganz catheter in place with tip in the region of the main pulmonary outflow tract. 2. Cardiomegaly.  Low lung  volumes with bronchovascular crowding. Electronically Signed   By: Melanie  Sanford M.D.   On: 11/29/2019 06:27   DG CHEST PORT 1 VIEW  Result Date: 12/18/2019 CLINICAL DATA:  Shortness of breath EXAM: PORTABLE CHEST 1 VIEW COMPARISON:  12/04/2019 FINDINGS: Cardiac shadow remains enlarged. The lungs are well aerated bilaterally without focal infiltrate or sizable effusion. New right jugular central line is noted with the catheter tip in the mid superior vena cava. No pneumothorax is seen. IMPRESSION: Status post central line placement as described. No pneumothorax is noted. Electronically Signed   By: Mark  Lukens M.D.   On: 12/12/2019 23:51   DG Chest Port 1 View  Result Date: 12/29/2019 CLINICAL DATA:  Chest pain for several days with shortness of breath EXAM: PORTABLE CHEST 1 VIEW COMPARISON:  09/05/2016 FINDINGS: Cardiac shadow is mildly enlarged but Century to by the portable technique. The lungs are well aerated bilaterally. No focal infiltrate or sizable effusion is seen. No bony abnormality is noted. IMPRESSION: No active disease. Electronically Signed   By: Mark  Lukens M.D.   On: 12/28/2019 16:58   ECHOCARDIOGRAM LIMITED  Result Date: 12/23/2019   ECHOCARDIOGRAM REPORT   Patient Name:   Rhylen M Hartt Date of Exam: 12/22/2019 Medical Rec #:  [id number]     Height:       71 .0 in Accession #:    [id number]  Weight:       303.0 lb Date of Birth:  1946/07/27     BSA:          2.52 m Patient Age:    73 years      BP:           113/54 mmHg Patient Gender: M             HR:           69 bpm. Exam Location:  Inpatient Procedure: Limited Echo                         STAT ECHO Reported to: Dr Weston Brass on 12/17/2019 7:33:00 PM. Indications:    Chest Pain 786.50 / R07.9  History:        Patient has prior history of Echocardiogram examinations, most                 recent 10/17/2019. Risk Factors:Sleep Apnea and Hypertension.  Sonographer:    Ross Ludwig RDCS (AE) Referring Phys: [id number] Corrin Parker  Sonographer Comments: Patient is morbidly obese and Technically difficult study due to poor echo windows. Image acquisition challenging due to patient body habitus. Echo performed in cath lab. IMPRESSIONS  1. Left ventricular ejection fraction, by visual estimation, is 50% though is suboptimally visualized. The left ventricle has low normal function. There is mildly increased left ventricular hypertrophy.  2. Global right ventricle has severely reduced systolic function.The right ventricular size is moderately enlarged. Right vetricular wall thickness was not assessed.  3. Moderate mitral annular calcification.  4. The mitral valve is normal in structure. No evidence of mitral valve regurgitation.  5. The tricuspid valve is normal in structure. Tricuspid valve regurgitation is trivial.  6. The aortic valve was not well visualized. Aortic valve regurgitation is not visualized.  7. The pulmonic valve was normal in structure. Pulmonic valve regurgitation is trivial.  8. Aortic dilatation noted.  9. There is mild dilatation of the aortic root measuring 40 mm. FINDINGS  Left Ventricle: Left ventricular ejection fraction, by visual estimation, is 50%. The left ventricle has low normal function. The left ventricle is not well visualized. There is mildly increased left ventricular hypertrophy. Right Ventricle: The right ventricular size is moderately enlarged. Right vetricular wall thickness was not assessed. Global RV systolic function is has severely reduced systolic function. Left Atrium: Left atrial size was not assessed. Right Atrium: Right atrial size was not assessed Pericardium: There is no evidence of pericardial effusion. Mitral Valve: The mitral valve is normal in structure. Moderate mitral annular calcification. No evidence of mitral valve regurgitation. Tricuspid Valve: The tricuspid valve is normal in structure. Tricuspid valve regurgitation is trivial. Aortic Valve: The aortic valve was not well  visualized. Aortic valve regurgitation is not visualized. Pulmonic Valve: The pulmonic valve was normal in structure. Pulmonic valve regurgitation is trivial. Pulmonic regurgitation is trivial. Aorta: Aortic dilatation noted. There is mild dilatation of the aortic root measuring 40 mm. Venous: The inferior vena cava was not well visualized. IAS/Shunts: The interatrial septum was not assessed.  LEFT VENTRICLE PLAX 2D LVIDd:         4.80 cm LVIDs:         3.30 cm LV PW:         1.70 cm LV IVS:        1.40 cm LVOT diam:     1.90 cm LV SV:  63 ml LV SV Index:   23.60 LVOT Area:     2.84 cm  LEFT ATRIUM         Index LA diam:    2.90 cm 1.15 cm/m   AORTA Ao Root diam: 4.00 cm Ao Asc diam:  3.60 cm  SHUNTS Systemic Diam: 1.90 cm  Weston BrassGayatri Acharya MD Electronically signed by Weston BrassGayatri Acharya MD Signature Date/Time: 12/19/2019/7:51:49 PM    Final     Labs: BMET Recent Labs  Lab 12/23/2019 1650 12/21/2019 1727 12/11/2019 [id number] 12/24/2019 [id number] 12/14/2019 [id number] 12/15/2019 [id number] 12/03/2019 0622 12/18/2019 0629 12/25/2019 0738  NA 128* 127* 126* 126* 132*  129* 129* 130* 129* 132*  K 4.9 5.4* 6.0* 6.0* 4.4  4.8 4.7 4.7 4.6 4.6  CL 92* 97*  --  97*  --  96*  --   --   --   CO2 19*  --   --  15*  --   --   --   --   --   GLUCOSE 204* 223*  --  281*  --  338*  --   --   --   BUN 62* 62*  --  68*  --  87*  --   --   --   CREATININE 4.18* 4.20*  --  4.91*  --  4.80*  --   --   --   CALCIUM 8.7*  --   --  7.6*  --   --   --   --   --    CBC Recent Labs  Lab 12/05/2019 1650 12/03/2019 0225 12/21/2019 0622 12/13/2019 0629 12/01/2019 0733 12/02/2019 0738  WBC 14.4* 24.0*  --   --  22.6*  --   HGB 12.7* 12.0* 12.6* 11.9* 11.4* 10.9*  HCT 38.6* 35.4* 37.0* 35.0* 34.0* 32.0*  MCV 91.7 89.6  --   --  92.6  --   PLT 177 159  --   --  155  --    Medications:    . aspirin  81 mg Oral Daily  . Chlorhexidine Gluconate Cloth  6 each Topical Daily  . [START ON 10/18/19] clopidogrel  75 mg Oral Q breakfast  . dextrose  1  ampule Intravenous Once  . heparin  5,000 Units Subcutaneous Q8H  . insulin aspart  0-20 Units Subcutaneous TID WC  . insulin aspart  10 Units Intravenous Once  . oxyCODONE  15 mg Oral Q12H  . sodium bicarbonate      . sodium chloride flush  10-40 mL Intracatheter Q12H  . sodium chloride flush  3 mL Intravenous Q12H  . sodium chloride flush  3 mL Intravenous Q12H   Zetta BillsJay Titania Gault, MD 12/11/2019, 8:04 AM

## 2019-12-09 NOTE — Progress Notes (Signed)
Overnight events reviewed Pt will need CRRT to correct metabolic disturbances and assist with volume status--> will need to be judicious with fluid removal in setting of akinetic RV I will enter CRRT orders--> pt in cath lab now Will contact PCCM re: HD cath placement Call with questions.  Madelon Lips MD [facility] pgr [phone]

## 2019-12-09 NOTE — Progress Notes (Signed)
ANTICOAGULATION CONSULT NOTE - Initial Consult  Pharmacy Consult for heparin Indication: Impella  Allergies  Allergen Reactions  . Hydrocortisone Rash  . Insulin Detemir   . Sulfa Antibiotics     "worst flu I've ever had"  . Cortisone Acetate [Cortisone] Rash    Patient Measurements: Height: 5\' 11"  (180.3 cm) Weight: (!) 303 lb (137.4 kg) IBW/kg (Calculated) : 75.3 Heparin Dosing Weight: 107  Vital Signs: Temp: 93.7 F (34.3 C) (12/11 1545) Temp Source: Oral (12/11 1200) BP: 130/91 (12/11 1842) Pulse Rate: 0 (12/11 1842)  Labs: Recent Labs    12/14/2019 1650 12/03/2019 1651 12/07/2019 2015 12-25-19 0048 2019/12/25 0051 December 25, 2019 0225 2019-12-25 0621 12-25-19 0733 12-25-2019 0734 Dec 25, 2019 1103 12/25/19 1510 Dec 25, 2019 1811 12/25/2019 1820  HGB 12.7*  --   --   --   --  12.0* 12.6* 11.4*  --   --  11.9* 12.2* 11.6*  HCT 38.6*  --   --   --   --  35.4* 37.0* 34.0*  --   --  35.0* 36.0* 34.0*  PLT 177  --   --   --   --  159  --  155  --   --   --   --   --   APTT  --  31  --   --   --   --   --   --   --   --   --   --   --   LABPROT  --  14.5  --   --   --   --   --   --  18.1*  --   --   --   --   INR  --  1.1  --   --   --   --   --   --  1.5*  --   --   --   --   CREATININE 4.18*  --   --    < > 4.91*  --  4.80*  --  5.44* 4.36*  --   --   --   TROPONINIHS >27,000*  --  >27,000*  --  >27,000*  --   --   --   --   --   --   --   --    < > = values in this interval not displayed.    Estimated Creatinine Clearance: 21.4 mL/min (A) (by C-G formula based on SCr of 4.36 mg/dL (H)).   Medical History: Past Medical History:  Diagnosis Date  . Benign essential HTN 09/04/2016  . Diabetes mellitus without complication ([place])   . Dilated aortic root ([place])    28mm by echo 2020  . Obesity (BMI 30-39.9) 03/26/2017  . OSA (obstructive sleep apnea) 03/26/2017   mild with AHI 7.4/hr and now on CPAP at 8cm H2O.    Assessment: 73 year old male s/p RP impella placed in cath lab this  evening.   Patient to be started on heparin purge solution post cath. Will check ACT hourly, if <160 will start low dose systemic heparin per ACT algorithm.   Goal of Therapy:  ACT 160-180 Monitor platelets by anticoagulation protocol: Yes   Plan:  Follow ACT algorithm for adjustments  Erin Hearing PharmD., BCPS Clinical Pharmacist 12-25-19 7:29 PM

## 2019-12-09 NOTE — Interval H&P Note (Signed)
History and Physical Interval Note:  12/06/2019 4:35 PM  [place]  has presented today for surgery, with the diagnosis of right heart failure.  The various methods of treatment have been discussed with the patient and family. After consideration of risks, benefits and other options for treatment, the patient has consented to  Procedure(s): VENTRICULAR ASSIST DEVICE INSERTION (N/A) and placement of a non-tunneled central venous dialysis catheter as a surgical intervention.  The patient's history has been reviewed, patient examined, no change in status, stable for surgery.  I have reviewed the patient's chart and labs.  Questions were answered to the patient's satisfaction.     Jerald Villalona

## 2019-12-09 NOTE — Consult Note (Addendum)
NAME:  Clarence Estrada, MRN:  [id number], DOB:  November 22, 1946, LOS: 1 ADMISSION DATE:  26-Oct-2019, CONSULTATION DATE:  12/11 REFERRING MD: Bensimhon   , CHIEF COMPLAINT:  Chest pain  Brief History   73 yo M with STEMI and subseqenet cardiogenic shock. Intubated, multiple pressors, starting CRRT.   History of present illness   History obtained from chart review  73 yo M PMH HTN DM, PVCs and PACs, mild dilated aortic root, OSA,  [facility] chest pain and found to have acute MI. Chest pain began Sunday prior to presentation, and has continued with gradual increase in SOB and fatigue. He endorsed near syncopal episodes but denied LOC. Endorsed known palpitations due to PAC/PVCs. In ED, ECG revealed inferior lead ST elevation and V5-V6 ST elevation. [facility]   During Cath, RCA found to be totally occluded and degree of clot burden inhibited revascularization. Small localized dissection of aortic root noted during catheterization and as patient was unable to lay still/flat procedure subsequently aborted. Patient became hypotensive and started on NE. ECHO reveals hypokinetic/akinetic RV. LV function did not appear significantly reduced (on NE). Nephrology consulted for worsening renal function. Overnight patient with worsening shock. Intubated 12/11. PCCM consulted for vent management.   Past Medical History    Significant Hospital Events   12/10 admitted with MI. RCA occlusion; taken for PCI with minimal restoration in flow due to high clot burden.  12/11 progressive shock, renal failure. Formal echo with worsening in interval (now with impaired LV function as well as previously noted RV dysfunction)  Intubated. On multiple pressors, CRRT to start today   Consults:  Heart failure Nephrology PCCM   Procedures:  12/11 CVC> 12/11 Arterial line> 12/11 ETT>   Significant Diagnostic Tests:  12/10 ECHO> technically difficult exam noted. LVEF  50%. Normal LV function, mild LV hypertrophy. Global RV severely reduced systolic function.   12/11 ECHO> poor windows noted. LVEF 30-35%. LV with severely decreased function.Increased LVH. Severe LV akinesis. RV enlarged. Severely reduced RV systolic function. Assessment of RV noted to be challenging on this exam   Micro Data:  12/10 SARS Cov2> neg   Antimicrobials:    Interim history/subjective:  Intubated and on multiple pressors   Objective   Blood pressure (!) 118/59, pulse 88, temperature 99.4 F (37.4 C), temperature source Oral, resp. rate (!) 24, height 5\' 11"  (1.803 m), weight (!) 137.4 kg, SpO2 100 %. CVP:  [10 mmHg-66 mmHg] 19 mmHg  Vent Mode: PRVC FiO2 (%):  [40 %-100 %] 40 % Set Rate:  [16 bmp] 16 bmp Vt Set:  [600 mL] 600 mL PEEP:  [5 cmH20-8 cmH20] 5 cmH20 Plateau Pressure:  [24 cmH20-26 cmH20] 26 cmH20   Intake/Output Summary (Last 24 hours) at 12/12/2019 0934 Last data filed at 12/13/2019 0930 Gross per 24 hour  Intake 1302.73 ml  Output 150 ml  Net 1152.73 ml   Filed Weights   Dec 11, 2019 1611  Weight: (!) 137.4 kg    Examination: General: critically ill appearing obese older adult M, intubated NAD  HENT: NCAT. IJ central access c/d/i. ETT secure. Anicteric sclera. Pink mmm Lungs: diminished breath sounds bilaterally. Symmetrical chest expansion. Synchronous with vent  Cardiovascular: RRR s1s2. Cap refill 3 seconds BUE BLE. BLE edema Abdomen: Obese soft round + bowel sounds  Extremities: R fem trialysis c/d/i. No obvious joint deformity. Symmetrical muscle bulk Neuro: Sedated. PERRL 2mm.  GU:  On CRRT   Resolved  Hospital Problem list     Assessment & Plan:  PCCM consulted for vent management    Acute Respiratory Failure requiring intubation P -ABG acquired by RT and vent changes made accordingly -Continue full MV support -PAD: goal RASS 0 to -1 -VAP bundle and pulm hygiene  -AM CXR   Cardiogenic shock -large inferoposterior MI -RCA  occlusion, s/p PCI but with minimal restoration of flow due to very heavy clot burden.  -Hypokinetic RV, severely dilated.  -Multiple asystolic events despite pressors and pacing Lactic Acidosis -in setting of shock. >8 with increased association in mortality  P -Vasoactive support per HF  -Is possible this patient may require RP impella  -ASA, plavix  -check mag, replete if indicated    Acute renal failure -presumed AKI on CKD Hyperkalemia P -Nephrology initiating CRRT  -Follow up renal ultrasound (previously ordered to r/o obstructive process)   Hypocalcemia P -will give calcium   DM P -Insulin gtt (endotool)  Leukocytosis -Tmax 99.8. Suspect reactive in setting of shock / associated hypermetabolic response, but with history of progressive weakness and "feeling unwell" cannot rule out concomitant infection -(of note, + BCx data would not reflect CLABSI as central lines are placed within past 24 hrs)  P -Will send Bcx UCx  -Trend WBC, fever   Dilated aortic root with small dissection noted during cardiac cath P -CVTS made aware by primary  Best practice:  Diet: NPO. Encourage early EN initiation  Pain/Anxiety/Delirium protocol (if indicated): Fentanyl, versed  VAP protocol (if indicated): yes DVT prophylaxis: SQ heparin GI prophylaxis: pepcid  Glucose control: Insulin gtt  Mobility: BR Code Status: Full  Family Communication: Per primary  Disposition: ICU   Labs   CBC: Recent Labs  Lab 12/11/2019 1650 12/25/2019 0225 12/17/2019 5053 12/21/2019 0622 12/23/2019 0629 12/13/2019 0733 12/12/2019 0738  WBC 14.4* 24.0*  --   --   --  22.6*  --   HGB 12.7* 12.0* 12.6* 12.6* 11.9* 11.4* 10.9*  HCT 38.6* 35.4* 37.0* 37.0* 35.0* 34.0* 32.0*  MCV 91.7 89.6  --   --   --  92.6  --   PLT 177 159  --   --   --  155  --     Basic Metabolic Panel: Recent Labs  Lab 12/11/2019 1650 12/20/2019 1727 12/22/2019 0051 12/14/2019 0621 11/29/2019 0622 12/07/2019 0629 12/24/2019 0734  12/23/2019 0738  NA 128* 127* 126* 129* 130* 129* 135 132*  K 4.9 5.4* 6.0* 4.7 4.7 4.6 4.8 4.6  CL 92* 97* 97* 96*  --   --  98  --   CO2 19*  --  15*  --   --   --  18*  --   GLUCOSE 204* 223* 281* 338*  --   --  341*  --   BUN 62* 62* 68* 87*  --   --  75*  --   CREATININE 4.18* 4.20* 4.91* 4.80*  --   --  5.44*  --   CALCIUM 8.7*  --  7.6*  --   --   --  9.0  --    GFR: Estimated Creatinine Clearance: 17.1 mL/min (A) (by C-G formula based on SCr of 5.44 mg/dL (H)). Recent Labs  Lab 12/28/2019 1650 12/29/2019 0010 12/12/2019 0225 12/28/2019 0733 12/24/2019 0741  WBC 14.4*  --  24.0* 22.6*  --   LATICACIDVEN  --  2.4*  --   --  8.4*    Liver Function Tests: Recent Labs  Lab 12/26/2019  0051  AST 168*  ALT 298*  ALKPHOS 53  BILITOT 3.8*  PROT 6.4*  ALBUMIN 3.0*   No results for input(s): LIPASE, AMYLASE in the last 168 hours. No results for input(s): AMMONIA in the last 168 hours.  ABG    Component Value Date/Time   PHART 7.203 (L) 12/06/2019 0738   PCO2ART 51.9 (H) 12/01/2019 0738   PO2ART 437.0 (H) 12/02/2019 0738   HCO3 20.4 12/27/2019 0738   TCO2 22 12/13/2019 0738   ACIDBASEDEF 8.0 (H) 12/25/2019 0738   O2SAT 54.9 12/13/2019 0755     Coagulation Profile: Recent Labs  Lab 01-04-2020 1651 12/24/2019 0734  INR 1.1 1.5*    Cardiac Enzymes: No results for input(s): CKTOTAL, CKMB, CKMBINDEX, TROPONINI in the last 168 hours.  HbA1C: Hgb A1c MFr Bld  Date/Time Value Ref Range Status  12/12/2019 12:52 AM 8.3 (H) 4.8 - 5.6 % Final    Comment:    (NOTE) Pre diabetes:          5.7%-6.4% Diabetes:              >6.4% Glycemic control for   <7.0% adults with diabetes   01-04-20 08:15 PM 8.4 (H) 4.8 - 5.6 % Final    Comment:    (NOTE) Pre diabetes:          5.7%-6.4% Diabetes:              >6.4% Glycemic control for   <7.0% adults with diabetes     CBG: Recent Labs  Lab 12/22/2019 0316 12/01/2019 0349 12/02/2019 0721 12/17/2019 0828  GLUCAP 407* 392* 324* 298*     Review of Systems:   Unable to obtain, intubated and sedated   Past Medical History  He,  has a past medical history of Benign essential HTN (09/04/2016), Diabetes mellitus without complication (HCC), Dilated aortic root (HCC), Obesity (BMI 30-39.9) (03/26/2017), and OSA (obstructive sleep apnea) (03/26/2017).   Surgical History    Past Surgical History:  Procedure Laterality Date  . CORONARY/GRAFT ACUTE MI REVASCULARIZATION N/A 04-Jan-2020   Procedure: CORONARY/GRAFT ACUTE MI REVASCULARIZATION;  Surgeon: Marykay Lex, MD;  Location: [facility] INVASIVE CV LAB;  Service: Cardiovascular;  Laterality: N/A;  . RIGHT HEART CATH N/A 12/22/2019   Procedure: RIGHT HEART CATH;  Surgeon: Dolores Patty, MD;  Location: [facility] INVASIVE CV LAB;  Service: Cardiovascular;  Laterality: N/A;  . TEMPORARY DIALYSIS CATHETER  12/23/2019   Procedure: TEMPORARY DIALYSIS CATHETER;  Surgeon: Dolores Patty, MD;  Location: [facility] INVASIVE CV LAB;  Service: Cardiovascular;;  . TEMPORARY PACEMAKER N/A 12/09/2019   Procedure: TEMPORARY PACEMAKER;  Surgeon: Dolores Patty, MD;  Location: [facility] INVASIVE CV LAB;  Service: Cardiovascular;  Laterality: N/A;     Social History   reports that he quit smoking about 31 years ago. He has never used smokeless tobacco. He reports current alcohol use. He reports that he does not use drugs.   Family History   His family history includes CAD in his sister; Heart disease in his sister; Hodgkin's lymphoma in his sister; Polymyositis in his father.   Allergies Allergies  Allergen Reactions  . Hydrocortisone Rash  . Insulin Detemir   . Sulfa Antibiotics     "worst flu I've ever had"  . Cortisone Acetate [Cortisone] Rash     Home Medications  Prior to Admission medications   Medication Sig Start Date End Date Taking? Authorizing Provider  allopurinol (ZYLOPRIM) 300 MG tablet Take 300 mg by mouth daily.  [provider]  APPLE CIDER VINEGAR PO Take by mouth as  directed.    [provider]  benazepril (LOTENSIN) 5 MG tablet Take 5 mg by mouth daily.    [provider]  Ginger, Zingiber officinalis, (GINGER ROOT) 500 MG CAPS Take by mouth as directed.    [provider]  ibuprofen (ADVIL,MOTRIN) 800 MG tablet Take 1 tablet (800 mg total) by mouth 3 (three) times daily. 07/09/13   Rancour, Jeannett Senior, MD  insulin glargine (LANTUS) 100 UNIT/ML injection Inject 50 Units into the skin daily.    [provider]  MAGNESIUM PO Take by mouth as directed.    [provider]  NOVOLIN 70/30 (70-30) 100 UNIT/ML injection Inject into the skin 2 (two) times daily with a meal. 80 units morning and 70 at night 08/04/19   [provider]  oxyCODONE (OXYCONTIN) 15 mg 12 hr tablet Take 15 mg by mouth every 12 (twelve) hours.    [provider]  tamsulosin (FLOMAX) 0.4 MG CAPS Take 0.4 mg by mouth.    [provider]  TRAMADOL HCL PO Take by mouth as directed.    [provider]  TURMERIC PO Take by mouth as directed.    [provider]     Critical care time      CRITICAL CARE Performed by: Lanier Clam   Total critical care time: 48 minutes  Critical care time was exclusive of separately billable procedures and treating other patients. Critical care was necessary to treat or prevent imminent or life-threatening deterioration.  Critical care was time spent personally by me on the following activities: development of treatment plan with patient and/or surrogate as well as nursing, discussions with consultants, evaluation of patient's response to treatment, examination of patient, obtaining history from patient or surrogate, ordering and performing treatments and interventions, ordering and review of laboratory studies, ordering and review of radiographic studies, pulse oximetry and re-evaluation of patient's condition.  Tessie Fass MSN, AGACNP-BC [facility] [phone] If no answer, [phone] 12/22/19, 9:34 AM

## 2019-12-10 ENCOUNTER — Inpatient Hospital Stay (HOSPITAL_COMMUNITY): Payer: PPO

## 2019-12-10 LAB — POCT ACTIVATED CLOTTING TIME
Activated Clotting Time: 147 seconds
Activated Clotting Time: 153 seconds
Activated Clotting Time: 158 seconds
Activated Clotting Time: 158 seconds
Activated Clotting Time: 158 seconds
Activated Clotting Time: 164 seconds
Activated Clotting Time: 164 seconds
Activated Clotting Time: 164 seconds
Activated Clotting Time: 164 seconds
Activated Clotting Time: 164 seconds
Activated Clotting Time: 164 seconds
Activated Clotting Time: 169 seconds
Activated Clotting Time: 180 seconds
Activated Clotting Time: 180 seconds

## 2019-12-10 LAB — POCT I-STAT 7, (LYTES, BLD GAS, ICA,H+H)
Acid-base deficit: 13 mmol/L — ABNORMAL HIGH (ref 0.0–2.0)
Acid-base deficit: 3 mmol/L — ABNORMAL HIGH (ref 0.0–2.0)
Acid-base deficit: 7 mmol/L — ABNORMAL HIGH (ref 0.0–2.0)
Acid-base deficit: 8 mmol/L — ABNORMAL HIGH (ref 0.0–2.0)
Bicarbonate: 14.9 mmol/L — ABNORMAL LOW (ref 20.0–28.0)
Bicarbonate: 18.3 mmol/L — ABNORMAL LOW (ref 20.0–28.0)
Bicarbonate: 19.2 mmol/L — ABNORMAL LOW (ref 20.0–28.0)
Bicarbonate: 23.2 mmol/L (ref 20.0–28.0)
Calcium, Ion: 0.99 mmol/L — ABNORMAL LOW (ref 1.15–1.40)
Calcium, Ion: 1.01 mmol/L — ABNORMAL LOW (ref 1.15–1.40)
Calcium, Ion: 1.07 mmol/L — ABNORMAL LOW (ref 1.15–1.40)
Calcium, Ion: 1.12 mmol/L — ABNORMAL LOW (ref 1.15–1.40)
HCT: 22 % — ABNORMAL LOW (ref 39.0–52.0)
HCT: 25 % — ABNORMAL LOW (ref 39.0–52.0)
HCT: 28 % — ABNORMAL LOW (ref 39.0–52.0)
HCT: 30 % — ABNORMAL LOW (ref 39.0–52.0)
Hemoglobin: 10.2 g/dL — ABNORMAL LOW (ref 13.0–17.0)
Hemoglobin: 7.5 g/dL — ABNORMAL LOW (ref 13.0–17.0)
Hemoglobin: 8.5 g/dL — ABNORMAL LOW (ref 13.0–17.0)
Hemoglobin: 9.5 g/dL — ABNORMAL LOW (ref 13.0–17.0)
O2 Saturation: 93 %
O2 Saturation: 96 %
O2 Saturation: 96 %
O2 Saturation: 97 %
Patient temperature: 36
Patient temperature: 36.4
Patient temperature: 36.8
Patient temperature: 36.8
Potassium: 4.3 mmol/L (ref 3.5–5.1)
Potassium: 4.4 mmol/L (ref 3.5–5.1)
Potassium: 4.5 mmol/L (ref 3.5–5.1)
Potassium: 4.7 mmol/L (ref 3.5–5.1)
Sodium: 137 mmol/L (ref 135–145)
Sodium: 137 mmol/L (ref 135–145)
Sodium: 138 mmol/L (ref 135–145)
Sodium: 140 mmol/L (ref 135–145)
TCO2: 16 mmol/L — ABNORMAL LOW (ref 22–32)
TCO2: 20 mmol/L — ABNORMAL LOW (ref 22–32)
TCO2: 20 mmol/L — ABNORMAL LOW (ref 22–32)
TCO2: 25 mmol/L (ref 22–32)
pCO2 arterial: 40.5 mmHg (ref 32.0–48.0)
pCO2 arterial: 40.6 mmHg (ref 32.0–48.0)
pCO2 arterial: 40.7 mmHg (ref 32.0–48.0)
pCO2 arterial: 45.2 mmHg (ref 32.0–48.0)
pH, Arterial: 7.171 — CL (ref 7.350–7.450)
pH, Arterial: 7.259 — ABNORMAL LOW (ref 7.350–7.450)
pH, Arterial: 7.28 — ABNORMAL LOW (ref 7.350–7.450)
pH, Arterial: 7.317 — ABNORMAL LOW (ref 7.350–7.450)
pO2, Arterial: 84 mmHg (ref 83.0–108.0)
pO2, Arterial: 86 mmHg (ref 83.0–108.0)
pO2, Arterial: 88 mmHg (ref 83.0–108.0)
pO2, Arterial: 97 mmHg (ref 83.0–108.0)

## 2019-12-10 LAB — CBC WITH DIFFERENTIAL/PLATELET
Abs Immature Granulocytes: 0.04 10*3/uL (ref 0.00–0.07)
Basophils Absolute: 0 10*3/uL (ref 0.0–0.1)
Basophils Relative: 0 %
Eosinophils Absolute: 0 10*3/uL (ref 0.0–0.5)
Eosinophils Relative: 0 %
HCT: 30.4 % — ABNORMAL LOW (ref 39.0–52.0)
Hemoglobin: 10.1 g/dL — ABNORMAL LOW (ref 13.0–17.0)
Immature Granulocytes: 0 %
Lymphocytes Relative: 10 %
Lymphs Abs: 1.1 10*3/uL (ref 0.7–4.0)
MCH: 31.4 pg (ref 26.0–34.0)
MCHC: 33.2 g/dL (ref 30.0–36.0)
MCV: 94.4 fL (ref 80.0–100.0)
Monocytes Absolute: 1.5 10*3/uL — ABNORMAL HIGH (ref 0.1–1.0)
Monocytes Relative: 13 %
Neutro Abs: 8.5 10*3/uL — ABNORMAL HIGH (ref 1.7–7.7)
Neutrophils Relative %: 77 %
Platelets: 115 10*3/uL — ABNORMAL LOW (ref 150–400)
RBC: 3.22 MIL/uL — ABNORMAL LOW (ref 4.22–5.81)
RDW: 14.9 % (ref 11.5–15.5)
WBC: 11.2 10*3/uL — ABNORMAL HIGH (ref 4.0–10.5)
nRBC: 0 % (ref 0.0–0.2)

## 2019-12-10 LAB — COOXEMETRY PANEL
Carboxyhemoglobin: 1.1 % (ref 0.5–1.5)
Methemoglobin: 0.6 % (ref 0.0–1.5)
O2 Saturation: 41 %
Total hemoglobin: 18.1 g/dL — ABNORMAL HIGH (ref 12.0–16.0)

## 2019-12-10 LAB — GLUCOSE, CAPILLARY
Glucose-Capillary: 97 mg/dL (ref 70–99)
Glucose-Capillary: 119 mg/dL — ABNORMAL HIGH (ref 70–99)
Glucose-Capillary: 137 mg/dL — ABNORMAL HIGH (ref 70–99)
Glucose-Capillary: 106 mg/dL — ABNORMAL HIGH (ref 70–99)
Glucose-Capillary: 84 mg/dL (ref 70–99)

## 2019-12-10 LAB — RENAL FUNCTION PANEL
Albumin: 2.3 g/dL — ABNORMAL LOW (ref 3.5–5.0)
Anion gap: 18 — ABNORMAL HIGH (ref 5–15)
BUN: 51 mg/dL — ABNORMAL HIGH (ref 8–23)
CO2: 17 mmol/L — ABNORMAL LOW (ref 22–32)
Calcium: 7.6 mg/dL — ABNORMAL LOW (ref 8.9–10.3)
Chloride: 104 mmol/L (ref 98–111)
Creatinine, Ser: 3.49 mg/dL — ABNORMAL HIGH (ref 0.61–1.24)
GFR calc Af Amer: 19 mL/min — ABNORMAL LOW (ref >60)
GFR calc non Af Amer: 16 mL/min — ABNORMAL LOW (ref >60)
Glucose, Bld: 118 mg/dL — ABNORMAL HIGH (ref 70–99)
Phosphorus: 6 mg/dL — ABNORMAL HIGH (ref 2.5–4.6)
Potassium: 4.8 mmol/L (ref 3.5–5.1)
Sodium: 139 mmol/L (ref 135–145)

## 2019-12-10 LAB — LACTIC ACID, PLASMA
Lactic Acid, Venous: 4.6 mmol/L (ref 0.5–1.9)
Lactic Acid, Venous: >11 mmol/L (ref 0.5–1.9)

## 2019-12-10 LAB — MAGNESIUM: Magnesium: 2.3 mg/dL (ref 1.7–2.4)

## 2019-12-10 LAB — CALCIUM, IONIZED: Calcium, Ionized, Serum: 3.8 mg/dL — ABNORMAL LOW (ref 4.5–5.6)

## 2019-12-10 LAB — LACTATE DEHYDROGENASE
LDH: 1392 U/L — ABNORMAL HIGH (ref 98–192)
LDH: 1454 U/L — ABNORMAL HIGH (ref 98–192)

## 2019-12-10 LAB — APTT: aPTT: 177 seconds (ref 24–36)

## 2019-12-10 LAB — HEPARIN LEVEL (UNFRACTIONATED): Heparin Unfractionated: 0.27 IU/mL — ABNORMAL LOW (ref 0.30–0.70)

## 2019-12-10 MED ORDER — SODIUM BICARBONATE 8.4 % IV SOLN
50.0000 meq | Freq: Once | INTRAVENOUS | Status: AC
  Administered 2019-12-10: 50 meq via INTRAVENOUS

## 2019-12-10 MED ORDER — ALBUMIN HUMAN 5 % IV SOLN
INTRAVENOUS | Status: AC
  Administered 2019-12-10: 12.5 g
  Filled 2019-12-10: qty 500

## 2019-12-10 MED ORDER — AMIODARONE LOAD VIA INFUSION
150.0000 mg | Freq: Once | INTRAVENOUS | Status: AC
  Administered 2019-12-10: 150 mg via INTRAVENOUS
  Filled 2019-12-10: qty 83.34

## 2019-12-10 MED ORDER — AMIODARONE HCL IN DEXTROSE 360-4.14 MG/200ML-% IV SOLN
60.0000 mg/h | INTRAVENOUS | Status: DC
  Administered 2019-12-10: 60 mg/h via INTRAVENOUS

## 2019-12-10 MED ORDER — AMIODARONE HCL IN DEXTROSE 360-4.14 MG/200ML-% IV SOLN
INTRAVENOUS | Status: AC
  Administered 2019-12-10: 150 mg/h
  Filled 2019-12-10: qty 200

## 2019-12-10 MED ORDER — SODIUM BICARBONATE 8.4 % IV SOLN
INTRAVENOUS | Status: AC
  Filled 2019-12-10: qty 50

## 2019-12-10 MED ORDER — VANCOMYCIN HCL 10 G IV SOLR: 1250.0000 mg | INTRAVENOUS | Status: DC

## 2019-12-10 MED ORDER — STERILE WATER FOR INJECTION IV SOLN
INTRAVENOUS | Status: DC
  Filled 2019-12-10: qty 850

## 2019-12-10 MED ORDER — VASOPRESSIN 20 UNIT/ML IV SOLN
0.0300 [IU]/min | INTRAVENOUS | Status: DC
  Administered 2019-12-10: 0.03 [IU]/min via INTRAVENOUS
  Filled 2019-12-10: qty 2

## 2019-12-10 MED ORDER — SODIUM CHLORIDE 0.9 % IV SOLN
2.0000 g | Freq: Two times a day (BID) | INTRAVENOUS | Status: DC
  Filled 2019-12-10 (×2): qty 2

## 2019-12-10 MED ORDER — AMIODARONE IV BOLUS ONLY 150 MG/100ML
150.0000 mg | Freq: Once | INTRAVENOUS | Status: DC
  Filled 2019-12-10: qty 100

## 2019-12-10 MED ORDER — ALBUMIN HUMAN 5 % IV SOLN
25.0000 g | Freq: Once | INTRAVENOUS | Status: AC
  Administered 2019-12-10: 12.5 g via INTRAVENOUS

## 2019-12-10 MED ORDER — SODIUM BICARBONATE 8.4 % IV SOLN
INTRAVENOUS | Status: AC
  Administered 2019-12-10: 50 meq
  Filled 2019-12-10: qty 50

## 2019-12-10 MED ORDER — AMIODARONE HCL IN DEXTROSE 360-4.14 MG/200ML-% IV SOLN
30.0000 mg/h | INTRAVENOUS | Status: DC
  Filled 2019-12-10: qty 200

## 2019-12-10 MED ORDER — SODIUM CHLORIDE 0.9% FLUSH: 10.0000 mL | INTRAVENOUS | Status: DC | PRN

## 2019-12-10 MED ORDER — VANCOMYCIN HCL 10 G IV SOLR
2000.0000 mg | Freq: Once | INTRAVENOUS | Status: AC
  Administered 2019-12-10: 2000 mg via INTRAVENOUS
  Filled 2019-12-10: qty 2000

## 2019-12-10 MED ORDER — SODIUM CHLORIDE 0.9% FLUSH
10.0000 mL | Freq: Two times a day (BID) | INTRAVENOUS | Status: DC
  Administered 2019-12-10: 10 mL

## 2019-12-10 MED ORDER — AMIODARONE HCL 200 MG PO TABS: 200.0000 mg | ORAL_TABLET | Freq: Every day | ORAL | Status: DC

## 2019-12-10 MED ORDER — SODIUM BICARBONATE 8.4 % IV SOLN
INTRAVENOUS | Status: AC
  Filled 2019-12-10: qty 100

## 2019-12-10 MED ORDER — AMIODARONE HCL 200 MG PO TABS: 200.0000 mg | ORAL_TABLET | Freq: Two times a day (BID) | ORAL | Status: DC

## 2019-12-12 ENCOUNTER — Encounter (HOSPITAL_COMMUNITY): Payer: Self-pay | Admitting: *Deleted

## 2019-12-12 ENCOUNTER — Encounter: Payer: Self-pay | Admitting: Cardiology

## 2019-12-12 LAB — GLUCOSE, CAPILLARY: Glucose-Capillary: 142 mg/dL — ABNORMAL HIGH (ref 70–99)

## 2019-12-12 NOTE — Progress Notes (Signed)
Received fax copy of death certificate that is needed asap for cremation.  Form completed and signed by Dr Haroldine Laws, form faxed back to [facility] at [phone].

## 2019-12-14 LAB — CULTURE, BLOOD (ROUTINE X 2)
Culture: NO GROWTH
Culture: NO GROWTH

## 2019-12-22 ENCOUNTER — Telehealth (HOSPITAL_COMMUNITY): Payer: Self-pay

## 2019-12-22 NOTE — Telephone Encounter (Signed)
Death certificate signed and mailed to [place] [facility]

## 2019-12-30 NOTE — Progress Notes (Signed)
Chaplain responded to page for family support postmortem. Chaplain had prayer with "Clarence Estrada" wife, two sons, daughter-in-law, sister, and granddaughter. Chaplain remains available as needs arise.   Chaplain Resident, Evelene Croon, [place] [phone] on-call

## 2019-12-30 NOTE — Progress Notes (Addendum)
NAME:  Clarence Estrada, MRN:  [id number], DOB:  Aug 20, 1946, LOS: 2 ADMISSION DATE:  12/26/2019, CONSULTATION DATE:  12/11 REFERRING MD: Bensimhon   , CHIEF COMPLAINT:  Chest pain  Brief History   74 yo M with STEMI and subseqenet cardiogenic shock. Intubated, multiple pressors, AKI needing CRRT.   History of present illness   History obtained from chart review  74 yo M PMH HTN DM, PVCs and PACs, mild dilated aortic root, OSA,  who presented to [facility] with CC chest pain and found to have acute MI. Chest pain began Sunday prior to presentation, and has continued with gradual increase in SOB and fatigue. He endorsed near syncopal episodes but denied LOC. Endorsed known palpitations due to PAC/PVCs. In ED, ECG revealed inferior lead ST elevation and V5-V6 ST elevation. Transferred from [facility] to [facility] for cardiac cath.   During Cath, RCA found to be totally occluded and degree of clot burden inhibited revascularization. Small localized dissection of aortic root noted during catheterization and as patient was unable to lay still/flat procedure subsequently aborted. Patient became hypotensive and started on NE. ECHO reveals hypokinetic/akinetic RV. LV function did not appear significantly reduced (on NE). Nephrology consulted for worsening renal function. Overnight patient with worsening shock. Intubated 12/11. PCCM consulted for vent management.   Past Medical History  HTN DM, PVCs and PACs, mild dilated aortic root, OSA  Significant Hospital Events   12/10 admitted with MI. RCA occlusion; taken for PCI with minimal restoration in flow due to high clot burden.  12/11 progressive shock, renal failure. Formal echo with worsening in interval (now with impaired LV function as well as previously noted RV dysfunction)  Intubated. On multiple pressors, CRRT, impella placed 12/12-worsening shock, maxed out on pressors.  Made DNR  Consults:  Heart failure Nephrology PCCM   Procedures:  12/11 CVC> 12/11  Arterial line> 12/11 ETT>   Significant Diagnostic Tests:  12/10 ECHO> technically difficult exam noted. LVEF 50%. Normal LV function, mild LV hypertrophy. Global RV severely reduced systolic function.   12/11 ECHO> poor windows noted. LVEF 30-35%. LV with severely decreased function.Increased LVH. Severe LV akinesis. RV enlarged. Severely reduced RV systolic function. Assessment of RV noted to be challenging on this exam   Micro Data:  SARS Cov2 12/10  neg  Blood cultures 12/11  Antimicrobials:  Cefepime, Vanco 12/12 >>  Interim history/subjective:  Intubated and on multiple pressors   Objective   Blood pressure (!) 75/48, pulse 88, temperature 99.1 F (37.3 C), resp. rate 19, height 5\' 11"  (1.803 m), weight (!) 137.4 kg, SpO2 94 %. PAP: (32-53)/(20-37) 39/29 CVP:  [13 mmHg-25 mmHg] 25 mmHg CO:  [3.3 L/min-4 L/min] 3.3 L/min CI:  [1.3 L/min/m2-1.6 L/min/m2] 1.3 L/min/m2  Vent Mode: PRVC FiO2 (%):  [40 %] 40 % Set Rate:  [16 bmp-20 bmp] 20 bmp Vt Set:  [600 mL] 600 mL PEEP:  [5 cmH20] 5 cmH20 Plateau Pressure:  [14 cmH20-26 cmH20] 24 cmH20   Intake/Output Summary (Last 24 hours) at 12/20/2019 0737 Last data filed at 12/18/2019 0700 Gross per 24 hour  Intake 9238.51 ml  Output 1213 ml  Net 8025.51 ml   Filed Weights   12/02/2019 1611  Weight: (!) 137.4 kg    Examination: Gen:   Acutely ill HEENT:  EOMI, sclera anicteric Neck:     No masses; no thyromegaly, ETT Lungs:    Clear to auscultation bilaterally; normal respiratory effort CV:         Regular rate  and rhythm; no murmurs Abd:      + bowel sounds; soft, non-tender; no palpable masses, no distension Ext:    No edema; adequate peripheral perfusion Skin:      Warm and dry; no rash Neuro: Sedated, unresponsive   Resolved Hospital Problem list     Assessment & Plan:  Acute Respiratory Failure secondary to MI P Continue full vent support Follow ABG, chest x-ray  Cardiogenic shock s/p Impella -large  inferoposterior MI -RCA occlusion, s/p PCI but with minimal restoration of flow due to very heavy clot burden.  -Hypokinetic RV, severely dilated.  -Multiple asystolic events despite pressors and pacing Lactic Acidosis -in setting of shock. >8 with increased association in mortality  P Currently maxed out on pressors Given multiple amps of bicarb for worsening acidosis. Start bicarb drip  Acute renal failure -presumed AKI on CKD Hyperkalemia P Started on CVVH.  Unable to tolerated current state due to low blood pressure We will give bicarb for severe metabolic acidosis  DM P -Insulin gtt (endotool)  Leukocytosis Suspect reactive in setting of shock / associated hypermetabolic response,  P Continue antibiotics, follow cultures  Dilated aortic root with small dissection noted during cardiac cath P -CVTS made aware by primary  Goals of care Patient is extremely ill with worsening hemodynamic status.  Currently maxed out on all therapies Discussed with wife and family at bedside.  Recommended DNR status and wife agreed that she would not want to put him through CPR CODE STATUS changed  Best practice:  Diet: NPO. NPO Pain/Anxiety/Delirium protocol (if indicated): Fentanyl, versed  VAP protocol (if indicated): yes DVT prophylaxis: SQ heparin GI prophylaxis: pepcid  Glucose control: Insulin gtt  Mobility: BR Code Status: Full  Family Communication: Per primary  Disposition: ICU   Labs/ Imaging reviewed.  Significant for  ABG 7.17/41/84/93%  BUN/creatinine 51/3.49, WBC 11.2, hemoglobin 10.1, platelets 115 Chest x-ray 12/11-cardiomegaly, vascular congestion with bibasilar atelectasis  Critical care time   The patient is critically ill with multiple organ system failure and requires high complexity decision making for assessment and support, frequent evaluation and titration of therapies, advanced monitoring, review of radiographic studies and interpretation of complex  data.   Critical Care Time devoted to patient care services, exclusive of separately billable procedures, described in this note is 45 minutes.   Marshell Garfinkel MD [facility] 2019/12/11, 7:41 AM

## 2019-12-30 NOTE — Progress Notes (Signed)
Patient ID: Clarence Estrada, male   DOB: 01/14/1946, 74 y.o.   MRN: [id number] Overnight events noted with right ventricular assist/Impella placement.  Unfortunately, continue to clinically decompensate and he is on maximum dose pressors with inability to tolerate CRRT further.  Now DNR and with imminent mortality.  I discussed with his wife at bedside the unfortunate sequence of events and offered support.  Renal service will sign off at this time and remain available for questions or concerns.  Elmarie Shiley MD [facility]. Office # [phone] Pager # [phone] 7:48 AM

## 2019-12-30 NOTE — Progress Notes (Signed)
ANTICOAGULATION CONSULT NOTE - Follow Up Consult  Pharmacy Consult for heparin Indication: Impella  Allergies  Allergen Reactions  . Hydrocortisone Rash  . Insulin Detemir   . Sulfa Antibiotics     "worst flu I've ever had"  . Cortisone Acetate [Cortisone] Rash    Patient Measurements: Height: 5\' 11"  (180.3 cm) Weight: (!) 303 lb (137.4 kg) IBW/kg (Calculated) : 75.3 Heparin Dosing Weight: 107  Vital Signs: Temp: 100.6 F (38.1 C) (12/12 0853) BP: 77/50 (12/12 0800) Pulse Rate: 88 (12/12 0853)  Labs: Recent Labs    12/19/2019 1650 12/28/2019 1650 12/25/2019 1651 12/07/2019 2015 12/12/19 0051 12-12-2019 0225 Dec 12, 2019 0548 12/12/2019 1027 12-12-19 0733 12-12-19 0734 12/12/2019 0738 2019/12/12 2016 December 12, 2019 2104 12/25/2019 0307 11/30/2019 0321 12/22/2019 0448 12/24/2019 0629 12/07/2019 0637  HGB 12.7*   < >  --   --   --  12.0*  --    < > 11.4*  --   --  10.5*  --  10.1* 9.5* 8.5*  --  7.5*  HCT 38.6*   < >  --   --   --  35.4*  --    < > 34.0*  --   --  31.0*  --  30.4* 28.0* 25.0*  --  22.0*  PLT 177  --   --   --   --  159  --   --  155  --   --   --   --  115*  --   --   --   --   APTT  --   --  31  --   --   --   --   --   --   --   --   --   --   --   --   --  177*  --   LABPROT  --   --  14.5  --   --   --   --   --   --  18.1*  --   --   --   --   --   --   --   --   INR  --   --  1.1  --   --   --   --   --   --  1.5*  --   --   --   --   --   --   --   --   HEPARINUNFRC  --   --   --   --   --   --   --   --   --   --   --   --   --  0.27*  --   --   --   --   CREATININE 4.18*  --   --   --  4.91*  --    < >  --   --  5.44*   < > 3.80* 3.51* 3.49*  --   --   --   --   TROPONINIHS >27,000*  --   --  >27,000* >27,000*  --   --   --   --   --   --   --   --   --   --   --   --   --    < > = values in this interval not displayed.    Estimated Creatinine Clearance: 26.7 mL/min (A) (by C-G formula based on SCr of 3.49 mg/dL (  H)).   Medical History: Past Medical History:   Diagnosis Date  . Benign essential HTN 09/04/2016  . Diabetes mellitus without complication (HCC)   . Dilated aortic root (HCC)    64mm by echo 2020  . Obesity (BMI 30-39.9) 03/26/2017  . OSA (obstructive sleep apnea) 03/26/2017   mild with AHI 7.4/hr and now on CPAP at 8cm H2O.    Assessment: 74 year old male s/p RP impella placed in cath lab on 12/11.   Heparin via purge solution + systemic heparin @ 700 units/hr with ACT therapeutic at 164.   Hgb down from 9.5>7.5 today, pltc down 155>115, LDH 1454  Goal of Therapy:  ACT 160-180 Monitor platelets by anticoagulation protocol: Yes   Plan:  Continue heparin in purge + systemic heparin per RN protocol Monitor ACTs, CBC, watch for bleeding  Danae Orleans, PharmD, [facility] PGY2 Cardiology Pharmacy Resident Phone [phone] 12/25/2019       10:42 AM  Please check [email or web address] for unit-specific pharmacist phone numbers

## 2019-12-30 NOTE — Progress Notes (Signed)
eLink Physician-Brief Progress Note Patient Name: Clarence Estrada DOB: [birth year] MRN: [id number]   Date of Service  12/20/2019  HPI/Events of Note  Notified of hypotension, MAP 50s. Maximum on levo and vasopressin, also on epinephrine. ABG 7.28/40/86. K 4.7  eICU Interventions  Will order 1 amp bicarb Albumin calcium to be given Heart failure team informed CCM ground team informed as well     Intervention Category Major Interventions: Hypotension - evaluation and management  Judd Lien 12/02/2019, 3:31 AM

## 2019-12-30 NOTE — Progress Notes (Signed)
Pharmacy Antibiotic Note  Clarence Estrada is a 74 y.o. male admitted on 12-12-19 now with concern for sepsis.  Pharmacy has been consulted for Cefepime and vancomycin dosing.  On CRRT currently, impella RP placed 12/11.    Plan: Vancomycin 2000 mg IV x 1, then 1250 mg q 24 hours Cefepime 2g IV every 12 hours Monitor CRRT and renal plans, Cx and clinical progression to narrow Vancomycin level as needed  Height: 5\' 11"  (180.3 cm) Weight: (!) 303 lb (137.4 kg) IBW/kg (Calculated) : 75.3  Temp (24hrs), Avg:96.9 F (36.1 C), Min:93.6 F (34.2 C), Max:99.4 F (37.4 C)  Recent Labs  Lab 2019/12/12 1650 12-Dec-2019 1727 12/15/2019 0051 12/07/2019 0225 12/02/2019 0621 12/21/2019 0733 12/16/2019 0734 12/20/2019 1103 12/11/2019 1233 12/28/2019 1350 12/11/2019 1503 12/21/2019 2016 12/19/2019 2026 12/23/2019 2104 12/12/2019 2315  WBC 14.4*  --   --  24.0*  --  22.6*  --   --   --   --   --   --   --   --   --   CREATININE 4.18*  --   --   --  4.80*  --  5.44* 4.36*  --   --   --  3.80*  --  3.51*  --   LATICACIDVEN  --    < >   < >  --   --   --   --  4.5* 8.2* 4.6* 5.5*  --  4.6*  --  4.6*   < > = values in this interval not displayed.    Estimated Creatinine Clearance: 26.5 mL/min (A) (by C-G formula based on SCr of 3.51 mg/dL (H)).    Allergies  Allergen Reactions  . Hydrocortisone Rash  . Insulin Detemir   . Sulfa Antibiotics     "worst flu I've ever had"  . Cortisone Acetate [Cortisone] Rash   Bertis Ruddy, PharmD Clinical Pharmacist Please check AMION for all [place] numbers 12/28/2019 4:06 AM

## 2019-12-30 NOTE — Progress Notes (Signed)
  Amiodarone Drug - Drug Interaction Consult Note  Recommendations: No current specific interactions; will monitor.  Amiodarone is metabolized by the cytochrome P450 system and therefore has the potential to cause many drug interactions. Amiodarone has an average plasma half-life of 50 days (range 20 to 100 days).   There is potential for drug interactions to occur several weeks or months after stopping treatment and the onset of drug interactions may be slow after initiating amiodarone.   []  Statins: Increased risk of myopathy. Simvastatin- restrict dose to 20mg  daily. Other statins: counsel patients to report any muscle pain or weakness immediately.  []  Anticoagulants: Amiodarone can increase anticoagulant effect. Consider warfarin dose reduction. Patients should be monitored closely and the dose of anticoagulant altered accordingly, remembering that amiodarone levels take several weeks to stabilize.  []  Antiepileptics: Amiodarone can increase plasma concentration of phenytoin, the dose should be reduced. Note that small changes in phenytoin dose can result in large changes in levels. Monitor patient and counsel on signs of toxicity.  []  Beta blockers: increased risk of bradycardia, AV block and myocardial depression. Sotalol - avoid concomitant use.  []   Calcium channel blockers (diltiazem and verapamil): increased risk of bradycardia, AV block and myocardial depression.  []   Cyclosporine: Amiodarone increases levels of cyclosporine. Reduced dose of cyclosporine is recommended.  []  Digoxin dose should be halved when amiodarone is started.  []  Diuretics: increased risk of cardiotoxicity if hypokalemia occurs.  []  Oral hypoglycemic agents (glyburide, glipizide, glimepiride): increased risk of hypoglycemia. Patient's glucose levels should be monitored closely when initiating amiodarone therapy.   []  Drugs that prolong the QT interval:  Torsades de pointes risk may be increased with  concurrent use - avoid if possible.  Monitor QTc, also keep magnesium/potassium WNL if concurrent therapy can't be avoided. Marland Kitchen Antibiotics: e.g. fluoroquinolones, erythromycin. . Antiarrhythmics: e.g. quinidine, procainamide, disopyramide, sotalol. . Antipsychotics: e.g. phenothiazines, haloperidol.  . Lithium, tricyclic antidepressants, and methadone.  Thank you.  Wynona Neat, PharmD, BCPS 12/23/2019 1:40 AM

## 2019-12-30 DEATH — deceased

## 2020-01-30 NOTE — Death Summary Note (Signed)
  Advanced Heart Failure Death Summary  Death Summary   Patient ID: Clarence Estrada MRN: [id number], DOB/AGE: [birth year] 74 y.o. Admit date: 12/02/2019 D/C date:     12-21-2019   Primary Discharge Diagnoses:  1. Cardiogenic shock 2. 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 AKI 4. Acute hypoxic respiratory failure 5. OSA 6. DM 7. 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 Patient presented to [facility] ED on 12/02/2019 with severe dyspnea and chest pain for 3 days.   EKG in the ED showed ST elevation in inferior leads and some ST elevation in V5-V6 as well. Patient felt to have large OOH MI but given ongoing CP transferred to [facility] for emergent cardiac catheterization. Initial hsTrop > 27K and creatinine 4.2 (creatinine 1.06 in 2017)  Upon arrival to cath lab, patient still having about 4/10 chest pain. Cardiac cath performed, RCA totally occluded very proximally.  Unable to open.  Very difficult to advance guidewire and the entire Vessel is full of clot.  Unfortunately during aspiration thrombectomy, the patient had a sudden movement secondary to back spasm causing the guide catheter to dive deep and next picture showed small Quarter sized small localized dissection in the aortic root.  The next picture was after reengagement with the catheter and this appeared to be slightly larger.  Discussed with Dr. Laneta Simmers from [facility].  Plan for now is stat limited echo to evaluate EF and for possible major dissection.   Post cath patient deteriorated due to worsening RV failure, CHB and renal failure. Intubated (coroanry dissection stable). Echo EF 30-35% with severe RV dysfunction.    I brought him back to cath lab and placed TVP, swan ganz catheter and trialysis catheter. Cath showed severe  RV failure/shock with CI 1.1 and PAPi 0.5. CPO 0.6. Not felt to be candidate for left-sided Impella.  Started on EPI and NE as well as CVVHD. Developed progressive acidosis.  Situation discussed extensively with his wife. Brought back to cath lab on 12/11 and RP impella placed.  However despite aggressive support patient continue with hemodynamic deterioration and MSOF. Eventually passed on 2019/12/21.    Duration of Discharge Encounter: Greater than 35 minutes   Signed, Arvilla Meres, MD 01/07/2020, 11:21 PM

## 2021-11-03 IMAGING — DX DG CHEST 1V PORT
1 series · 1 of 1 positions shown · non-contrast
Comparison: 3931 hours earlier today.

CLINICAL DATA: 73-year-old male with chest pain, STEMI.

EXAM:
PORTABLE CHEST 1 VIEW

[chest ap]
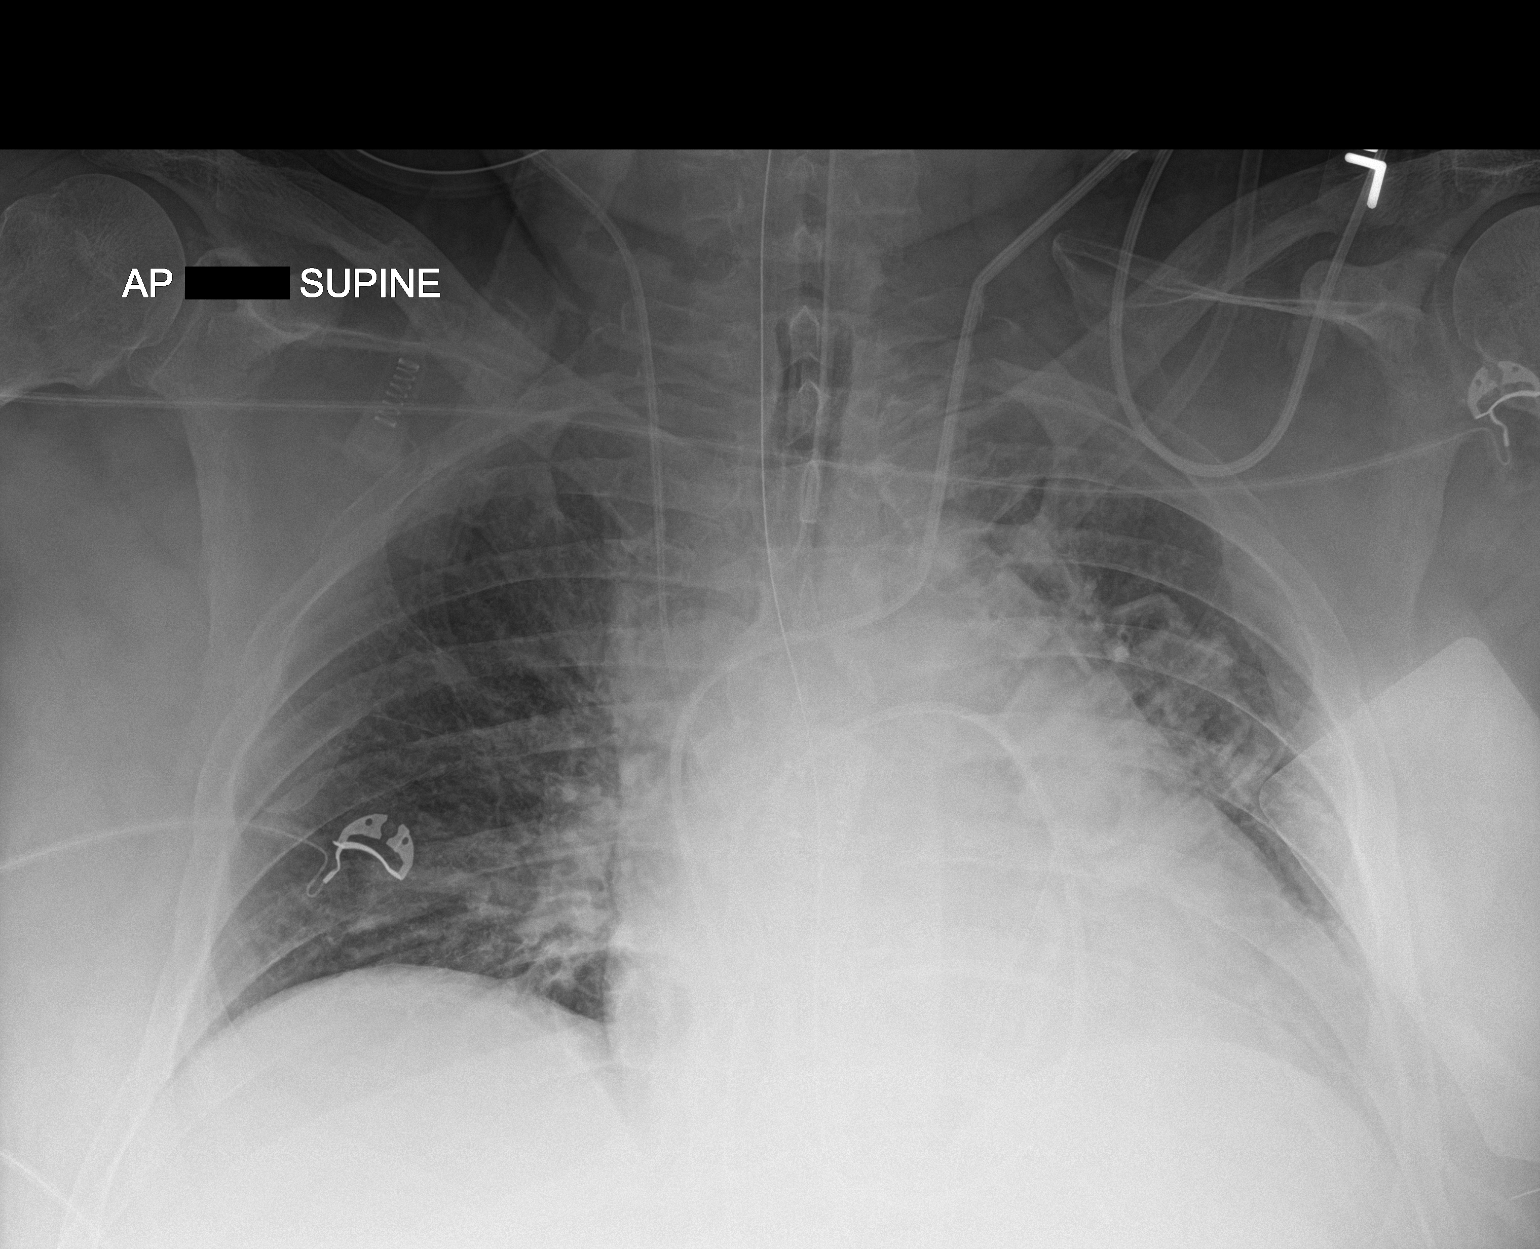

[1 of 1 positions shown; findings below may reference images not displayed]

FINDINGS: Portable AP supine view at 1959 hours. Intubated now. Endotracheal
tube tip at the level the clavicles. Enteric tube placed and courses
to the abdomen, tip not included. Left IJ approach Swan-Ganz
catheter appears stable. Stable superimposed right IJ central line.
Left chest pacer or resuscitation lead projects laterally as before.

Mildly lower lung volumes. Stable cardiac size and mediastinal
contours. No pneumothorax, pulmonary edema or pleural effusion.
IMPRESSION: 1. Endotracheal tube tip at the level of the clavicles. Enteric tube
courses to the abdomen, tip not included.
2. Otherwise stable lines and tubes.
3. Lower lung volumes with mild atelectasis.

## 2021-11-03 IMAGING — US US RENAL
1 series · 14 of 25 positions shown · non-contrast
Comparison: CT 07/09/2013

CLINICAL DATA: Acute renal injury

EXAM:
RENAL / URINARY TRACT ULTRASOUND COMPLETE

[Series 1: us renal · 14 of 45 slices shown]
[im 1/45]
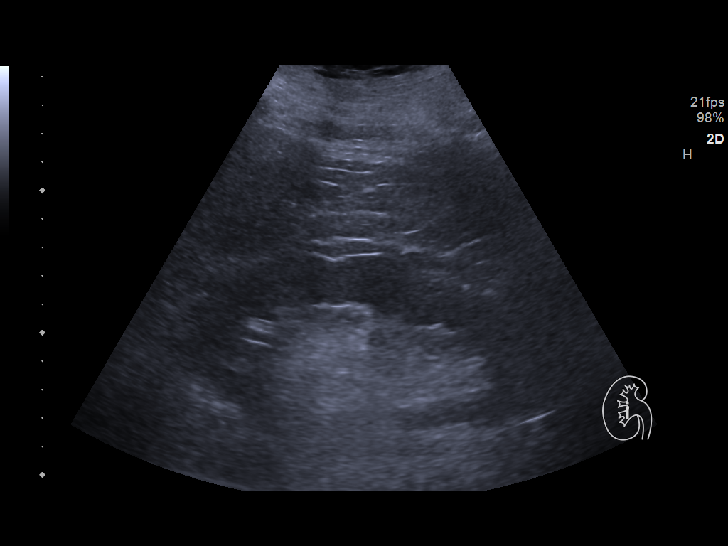
[im 4/45]
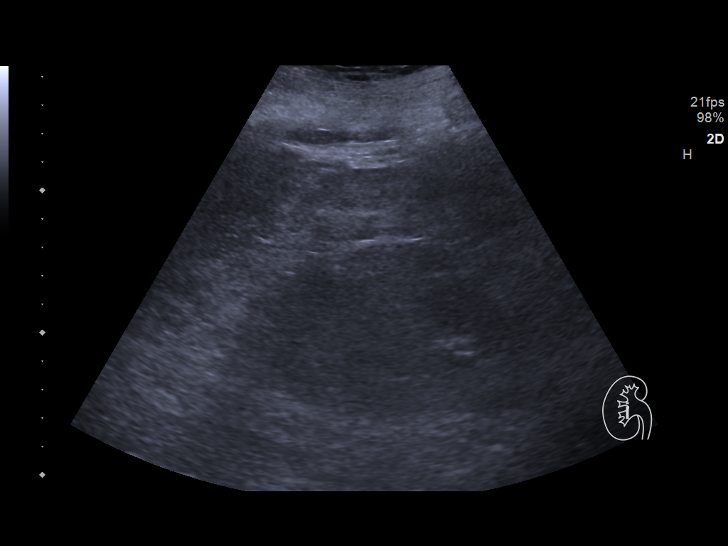
[im 8/45]
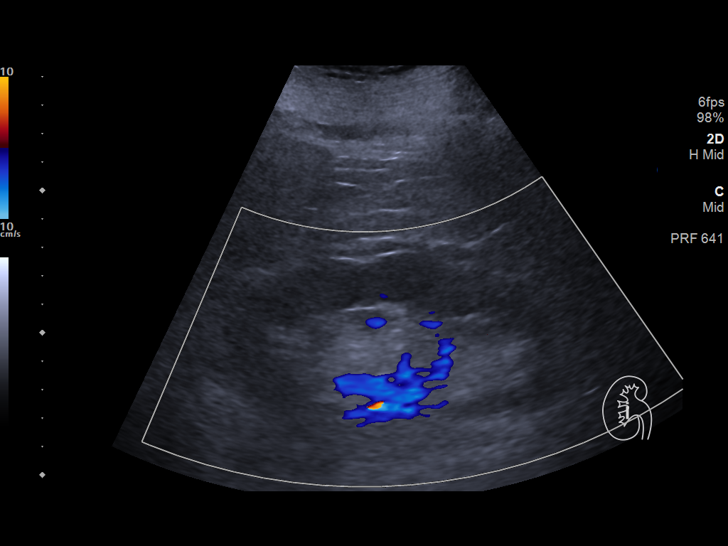
[im 12/45]
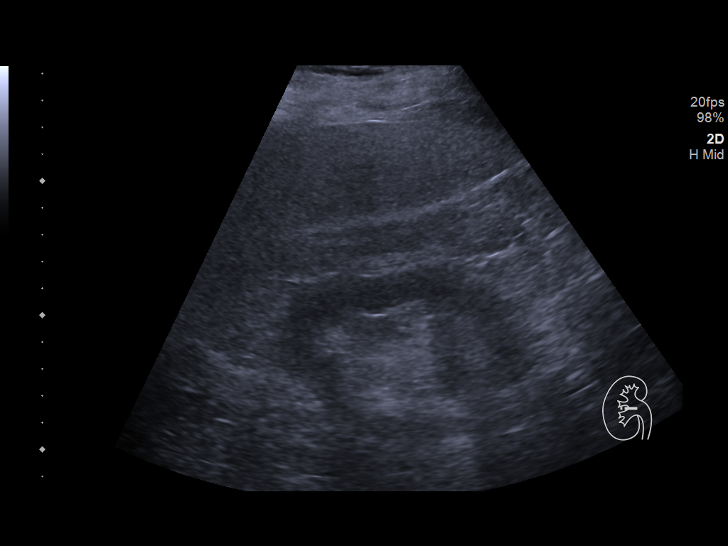
[im 15/45]
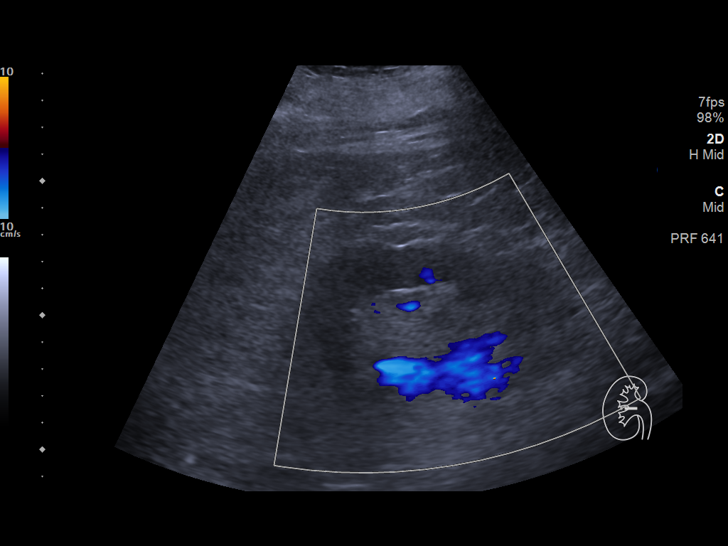
[im 17/45]
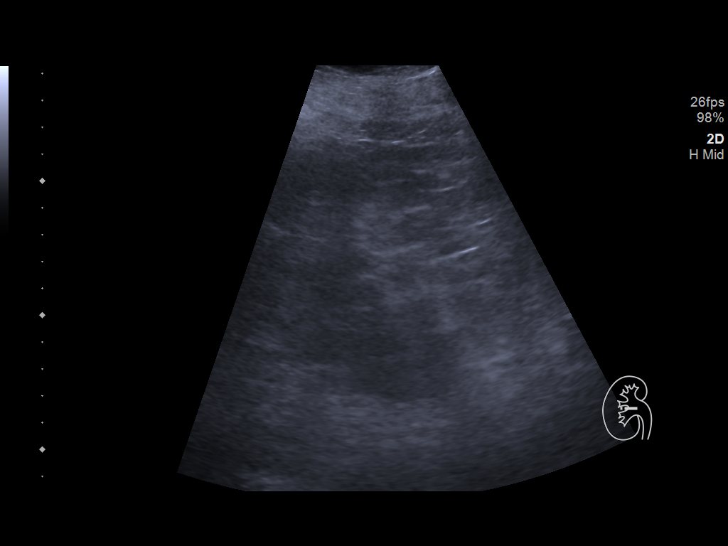
[im 21/45]
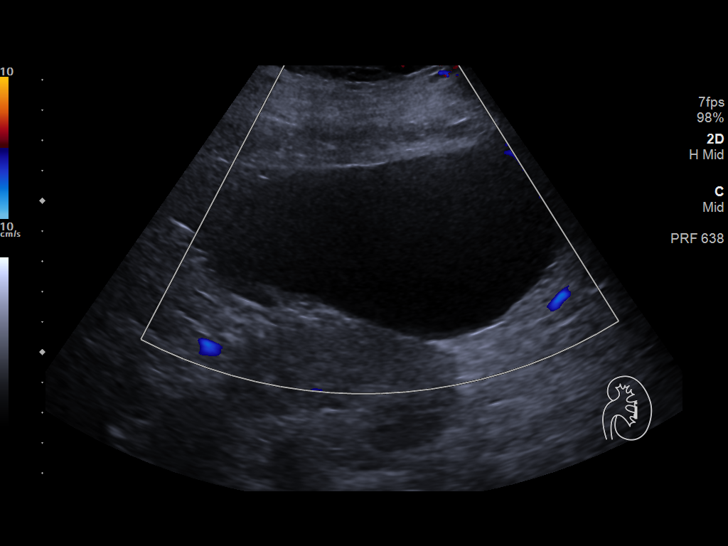
[im 24/45]
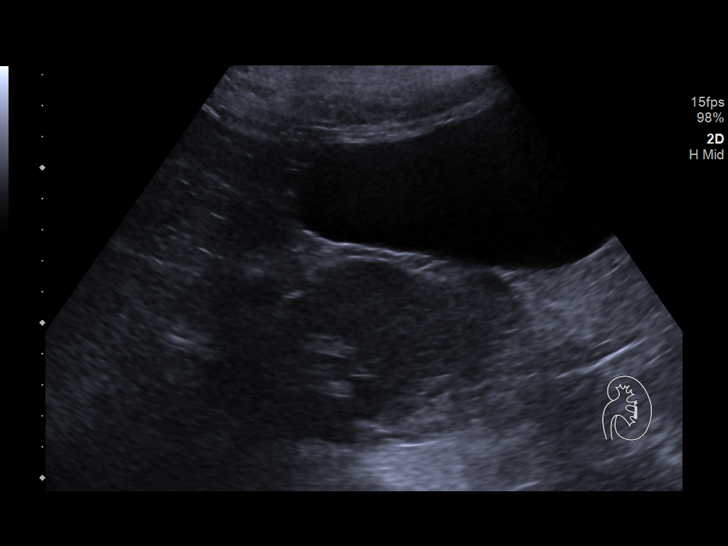
[im 28/45]
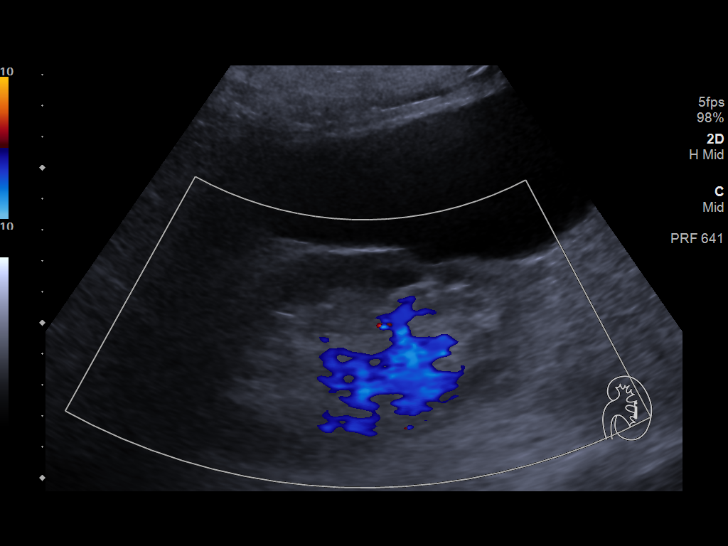
[im 30/45]
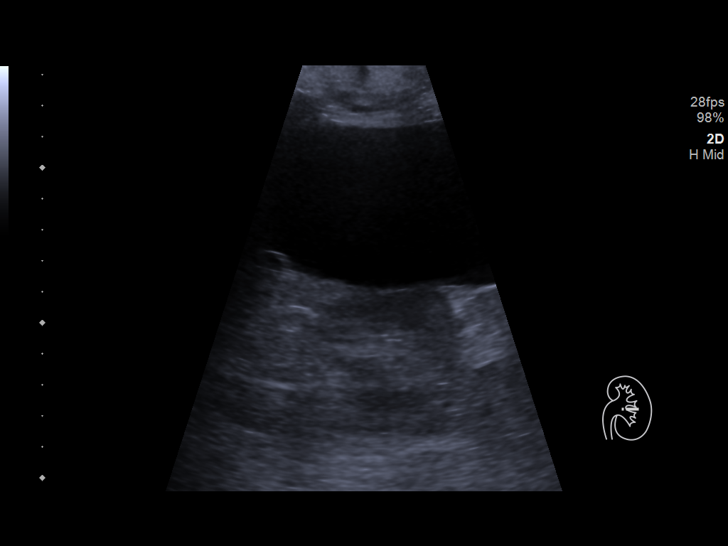
[im 34/45]
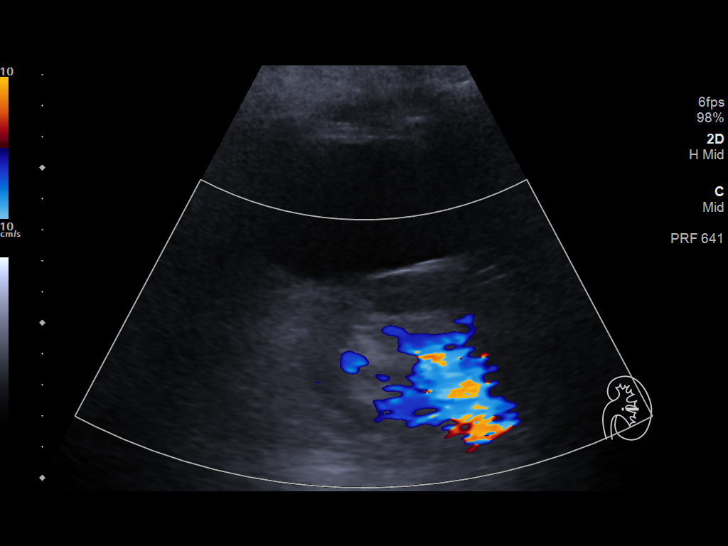
[im 37/45]
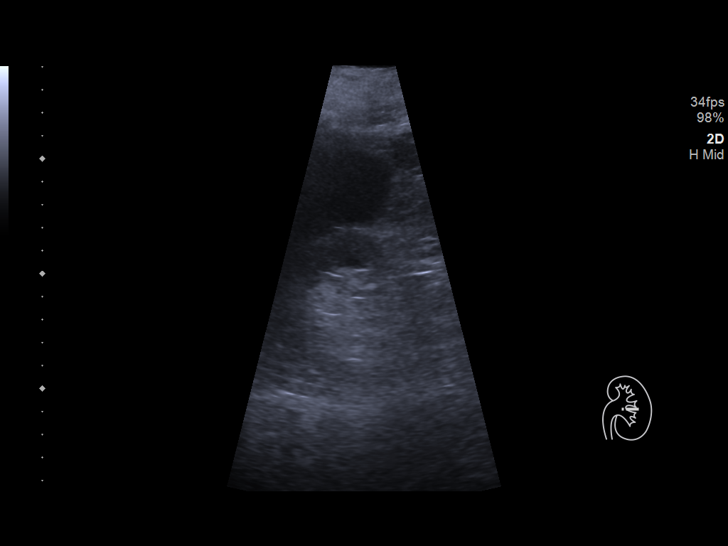
[im 41/45]
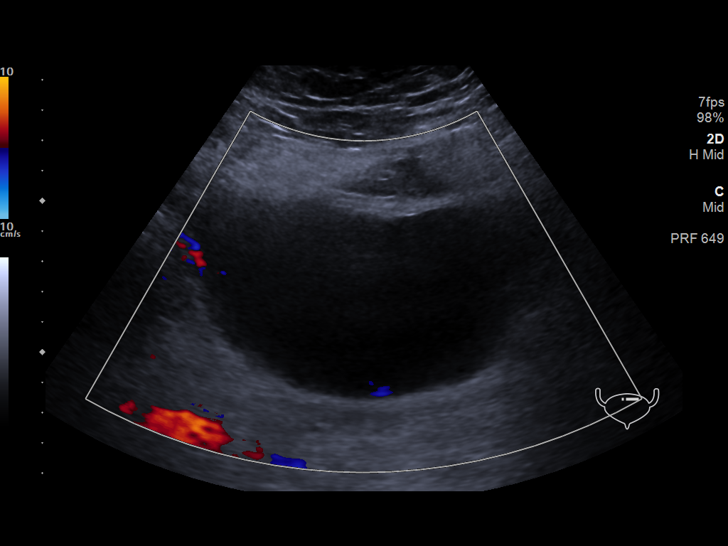
[im 45/45]
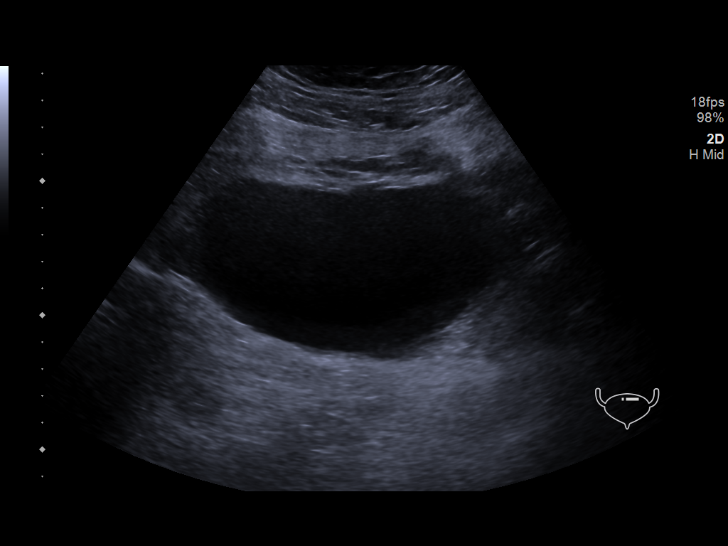

[14 of 25 positions shown; findings below may reference images not displayed]

FINDINGS: Right Kidney:

Renal measurements: 13.6 x 7.0 x 6.6 cm = volume: 328 mL .
Echogenicity within normal limits. No mass or hydronephrosis
visualized.

Left Kidney:

Renal measurements: 11.3 x 6.8 x 5.7 cm = volume: 230 mL.
Echogenicity within normal limits. No hydronephrosis visualized.
Large exophytic benign appearing anechoic renal cyst extends from
the LEFT lateral kidney measuring 11 cm. This is not changed from CT
07/09/2013.

Bladder:

Appears normal for degree of bladder distention.

Other:

None.
IMPRESSION: 1. No hydronephrosis.
2. Large benign LEFT renal cyst.
